# Patient Record
Sex: Female | Born: 1970 | Race: White | Hispanic: No | Marital: Married | Smoking: Never smoker
Health system: Southern US, Community
[De-identification: ages and names within clinical notes are randomized; demographics above are authoritative.]

---

## 2008-01-12 ENCOUNTER — Ambulatory Visit (HOSPITAL_COMMUNITY): Admission: RE | Admit: 2008-01-12 | Discharge: 2008-01-12 | Payer: Self-pay | Admitting: Obstetrics and Gynecology

## 2009-09-06 ENCOUNTER — Inpatient Hospital Stay (HOSPITAL_COMMUNITY): Admission: AD | Admit: 2009-09-06 | Discharge: 2009-09-06 | Payer: Self-pay | Admitting: Obstetrics and Gynecology

## 2009-09-19 ENCOUNTER — Inpatient Hospital Stay (HOSPITAL_COMMUNITY): Admission: AD | Admit: 2009-09-19 | Discharge: 2009-09-21 | Payer: Self-pay | Admitting: Obstetrics & Gynecology

## 2010-08-01 ENCOUNTER — Ambulatory Visit (HOSPITAL_COMMUNITY): Admission: RE | Admit: 2010-08-01 | Discharge: 2010-08-01 | Payer: Self-pay | Admitting: Obstetrics and Gynecology

## 2010-11-26 LAB — PREGNANCY, URINE: Preg Test, Ur: NEGATIVE

## 2010-12-01 LAB — CBC
HCT: 32.2 % — ABNORMAL LOW (ref 36.0–46.0)
HCT: 38.2 % (ref 36.0–46.0)
Hemoglobin: 10.9 g/dL — ABNORMAL LOW (ref 12.0–15.0)
Hemoglobin: 13 g/dL (ref 12.0–15.0)
MCHC: 34 g/dL (ref 30.0–36.0)
MCHC: 34 g/dL (ref 30.0–36.0)
MCV: 95.4 fL (ref 78.0–100.0)
MCV: 96.8 fL (ref 78.0–100.0)
Platelets: 145 10*3/uL — ABNORMAL LOW (ref 150–400)
Platelets: 158 10*3/uL (ref 150–400)
RBC: 3.33 MIL/uL — ABNORMAL LOW (ref 3.87–5.11)
RBC: 4.01 MIL/uL (ref 3.87–5.11)
RDW: 12.6 % (ref 11.5–15.5)
RDW: 12.9 % (ref 11.5–15.5)
WBC: 10.8 10*3/uL — ABNORMAL HIGH (ref 4.0–10.5)
WBC: 11 10*3/uL — ABNORMAL HIGH (ref 4.0–10.5)

## 2010-12-01 LAB — RPR: RPR Ser Ql: NONREACTIVE

## 2011-06-10 LAB — ABO/RH: ABO/RH(D): O POS

## 2015-11-28 ENCOUNTER — Encounter: Payer: Self-pay | Admitting: Sports Medicine

## 2015-11-28 ENCOUNTER — Ambulatory Visit
Admission: RE | Admit: 2015-11-28 | Discharge: 2015-11-28 | Disposition: A | Discharge status: Home / Self Care | Payer: BC Managed Care – PPO | Source: Ambulatory Visit | Attending: Sports Medicine | Admitting: Sports Medicine

## 2015-11-28 ENCOUNTER — Ambulatory Visit (INDEPENDENT_AMBULATORY_CARE_PROVIDER_SITE_OTHER): Payer: BC Managed Care – PPO | Admitting: Sports Medicine

## 2015-11-28 VITALS — BP 115/64 | Ht 64.0 in | Wt 115.0 lb

## 2015-11-28 DIAGNOSIS — M79672 Pain in left foot: Secondary | ICD-10-CM

## 2015-11-28 NOTE — Progress Notes (Signed)
   Subjective:    Patient ID: Sheri Shannon, female    DOB: 09/18/1970, 45 y.o.   MRN: [id number]  HPI chief complaint: Left foot pain  Very pleasant 45 year old female runner comes in today complaining of 6 weeks of left foot pain. No trauma that she can recall but a gradual onset of pain that she localizes to the plantar aspect of her MTP joint. It was initially present with running but is now to the point to where she has pain with walking as well. Her pain is intermittent. In fact, she was able to do a 6 mile trail run this past weekend with little discomfort. She has not noticed any swelling. She does get some pain that radiates proximally into her arch but she denies any heel pain. No change in shoes. She denies any increase in mileage. No similar problems in the past. She does get some occasional numbness into the arch of her foot as well as but denies any numbness into her toes. She has been using an off-the-shelf gel insert in her shoes and that has helped some. No prior surgeries to her foot or ankle in the past. No pain at rest.  Past medical history reviewed Medications reviewed Allergies reviewed    Review of Systems    as above Objective:   Physical Exam  Well-developed, fit appearing. No acute distress. Awake alert and oriented 3. Vital signs reviewed  Left foot: Patient has tenderness to palpation over the fibular sesamoid. No soft tissue swelling. There is some callus buildup along the medial aspect of her MTP joint and the medial most aspect of her right great toe. No tenderness to palpation across the dorsum of the foot. Very mild bunion deformity. Good passive and active MTP range of motion. Fairly well-preserved longitudinal arch with standing. Pronation with walking. Neurovascularly intact distally. Walking without a limp.  MSK ultrasound of the left foot was performed. Limited image of the sesamoid bones was obtained. There is some nonspecific edema around the fibular  sesamoid but no obvious signs of stress fracture. X-ray of the sesamoid bones is unremarkable. No obvious stress fracture. No bipartite sesamoid.      Assessment & Plan:   Left foot pain likely secondary to sesamoiditis  No running for the next week. Patient is okay to cross train on a bike. We will fit her running shoes with a green sports insole and a dancer's pad to help unload pressure on the sesamoids. She may try running again in one week but if she has returning pain then she will need to wait an additional week before resuming running again. She can ice as needed. Over-the-counter anti-inflammatories as needed. Follow-up with me in 3 weeks for reevaluation. If symptoms persist or worsen I may need to consider merits of further diagnostic imaging. Patient will call with questions or concerns prior to her follow-up visit.

## 2015-12-12 ENCOUNTER — Ambulatory Visit: Payer: BC Managed Care – PPO | Admitting: Sports Medicine

## 2015-12-19 ENCOUNTER — Encounter: Payer: Self-pay | Admitting: Family Medicine

## 2015-12-19 ENCOUNTER — Ambulatory Visit (INDEPENDENT_AMBULATORY_CARE_PROVIDER_SITE_OTHER): Payer: BC Managed Care – PPO | Admitting: Family Medicine

## 2015-12-19 VITALS — BP 110/68

## 2015-12-19 DIAGNOSIS — M2580 Other specified joint disorders, unspecified joint: Secondary | ICD-10-CM | POA: Diagnosis not present

## 2015-12-19 NOTE — Progress Notes (Signed)
   Subjective:    Patient ID: Adelene Amasayna Vieth, female    DOB: 07/19/1971, 45 y.o.   MRN: [id number]  HPI chief complaint: Left foot pain  Very pleasant 45 year old female runner comes in today for f/u of left foot pain/Fibular sesamoiditis. No trauma that she can recall but a gradual onset of pain that she localizes to the plantar aspect of her MTP joint.  No prior surgeries to her foot or ankle in the past. No pain at rest. She was last seen on 11/28/2015. She had a ultrasound consistent with sesamoiditis and a negative x-ray. She rested from running for a week and a half and then slowly returned to running. We did give her a dancer's pad with a green sports insole. Unfortunately the height of the dancers pad was too much and gave her a blister. She went online and found a gel adhesive dancer's pad which sticks to the foot, which has helped greatly. She reports having minimal pain in the last couple weeks. She did run a 8 mile run this past weekend without a pad and felt no pain. She does still have pain occasionally when walking her dog, who pulls very hard. She suspects walking her dog may have caused this pain initially as they just got the dog in December. Additionally she doesn't iron strength workout barefoot which includes plyometric jumps and squats. She has stopped doing these barefoot now. She is interested in doing a trial half marathon in several weeks.  Past medical history reviewed Medications reviewed Allergies reviewed    Review of Systems    as above Objective:   Physical Exam  Well-developed, fit appearing. No acute distress. Awake alert and oriented 3. Vital signs reviewed  Left foot: Patient has Minimal  tenderness to palpation over the fibular sesamoid. No soft tissue swelling. There is some callus buildup along the medial aspect of her MTP joint and the medial most aspect of her right great toe. No tenderness to palpation across the dorsum of the foot. Very mild bunion  deformity. Good passive and active MTP range of motion. Fairly well-preserved longitudinal arch with standing. Pronation with walking. Neurovascularly intact distally. Walking without a limp.  MSK ultrasound of the left foot was performed. Limited image of the sesamoid bones was obtained. There is  no significant edema around the fibular sesamoid nor obvious sign of stress fracture.        Assessment & Plan:   Left foot pain likely secondary to sesamoiditis, Fibular Mrs. Clippard is a very pleasant 45 year old runner who presents for follow-up of left fibular sesamoiditis. Over the last few weeks she has rested and slowly returned to running, although no trail running. She is doing very well and has no pain while running. Ultrasound and exam are largely unremarkable today. She suspects that some of her pain is due to a plyometric workout she does barefoot in addition to walking their are new dog, which is what triggers her pain at this point. She wishes to do a trail half marathon at the end of the month. We do think it is okay if she slowly transitions back to Trail running. She should continue wearing the adhesive sesamoid pad while walking her dog or while doing other things which exacerbate her pain. We will see her back as needed.

## 2016-07-01 ENCOUNTER — Ambulatory Visit: Payer: BC Managed Care – PPO | Admitting: Sports Medicine

## 2016-07-02 ENCOUNTER — Telehealth: Payer: Self-pay | Admitting: Sports Medicine

## 2016-07-02 ENCOUNTER — Encounter: Payer: Self-pay | Admitting: Sports Medicine

## 2016-07-02 ENCOUNTER — Ambulatory Visit
Admission: RE | Admit: 2016-07-02 | Discharge: 2016-07-02 | Disposition: A | Discharge status: Home / Self Care | Payer: BC Managed Care – PPO | Source: Ambulatory Visit | Attending: Sports Medicine | Admitting: Sports Medicine

## 2016-07-02 ENCOUNTER — Ambulatory Visit (INDEPENDENT_AMBULATORY_CARE_PROVIDER_SITE_OTHER): Payer: BC Managed Care – PPO | Admitting: Sports Medicine

## 2016-07-02 VITALS — BP 109/67 | Ht 64.0 in | Wt 130.0 lb

## 2016-07-02 DIAGNOSIS — M25531 Pain in right wrist: Secondary | ICD-10-CM

## 2016-07-02 NOTE — Assessment & Plan Note (Signed)
We'll get x-rays of her wrist to check for fracture. Concern for hook of hamate fracture or scaphoid fracture. She is most tender over the hamate. We will call with results. She can take over-the-counter anti-inflammatories as needed for pain. If her x-rays are negative she'll follow-up in 4 weeks if her symptoms are not improved.

## 2016-07-02 NOTE — Progress Notes (Signed)
  Sheri BasqueDayna Shannon - 45 y.o. female MRN [id number]  Date of birth: 1970/12/29  SUBJECTIVE:  Including CC & ROS.  CC: right wrist pain Complains of right wrist pain that began 2 months ago after falling off her mountain bike landing forward on flexed outstretched hand. She landed on her wrist. She denies any swelling and had some moderate pain. She reports that about a month later she fell again with less impact while she was trail running and she thinks she hit the same area.  She reports that her pain never has become better. She is right-handed. She has pain when she picks up things. Denies any numbness or tingling. She has not taken anything for the pain except for ibuprofen and Aleve. This does help the pain.   ROS: No unexpected weight loss, fever, chills, swelling, instability, muscle pain, numbness/tingling, redness, otherwise see HPI   PMHx - Updated and reviewed.  Contributory factors include: Negative PSHx - Updated and reviewed.  Contributory factors include:  Negative FHx - Updated and reviewed.  Contributory factors include:  Negative Social Hx - Updated and reviewed. Contributory factors include: Negative Medications - reviewed   DATA REVIEWED: None  PHYSICAL EXAM:  VS: BP:109/67  HR: bpm  TEMP: ( )  RESP:   HT:5\' 4"  (162.6 cm)   WT:130 lb (59 kg)  BMI:22.4 PHYSICAL EXAM: Gen: NAD, alert, cooperative with exam, well-appearing HEENT: clear conjunctiva,  CV:  no edema, capillary refill brisk, normal rate Resp: non-labored Skin: no rashes, normal turgor  Neuro: no gross deficits.  Psych:  alert and oriented  Wrist and hand: No swelling or erythema noted on inspection. Has tenderness to palpation along hamate bone, +anatomic snuffbox pain but mild, no distal radius or ulna, metacarpophalangeal joints, PIP joint, DIP joint Full range of motion of wrist at flexion, extension, supination, pronation, radial deviation, ulnar deviation bilaterally Full range of motion at  metacarpal phalangeal joints, PIP and DIP joints bilaterally Full strength and above planes of the wrist and hand Finkelstein's negative, Tinel's negative, Phalen's negative Sensation intact and pulses intact bilaterally  ASSESSMENT & PLAN:   Right wrist pain We'll get x-rays of her wrist to check for fracture. Concern for hook of hamate fracture or scaphoid fracture. She is most tender over the hamate. We will call with results. She can take over-the-counter anti-inflammatories as needed for pain. If her x-rays are negative she'll follow-up in 4 weeks if her symptoms are not improved.  Addendum: X-rays including a carpal tunnel view are negative for fracture. Before proceeding with further diagnostic imaging I've simply reassured the patient that her x-rays are normal. She will avoid any unnecessary pressure over the palm of her hand and if symptoms do not resolve over the next 3-4 weeks then she will notify me and I would reconsider merits of further diagnostic imaging likely in the form of an MRI.

## 2016-07-03 NOTE — Telephone Encounter (Signed)
Opened by accident

## 2017-09-15 IMAGING — CR DG FOOT COMPLETE 3+V*L*
4 series · 4 of 4 positions shown · non-contrast
Comparison: None.

CLINICAL DATA: Left foot pain for 6 weeks.

EXAM:
LEFT FOOT - COMPLETE 3+ VIEW

[t foot ap left (1 of 2)]
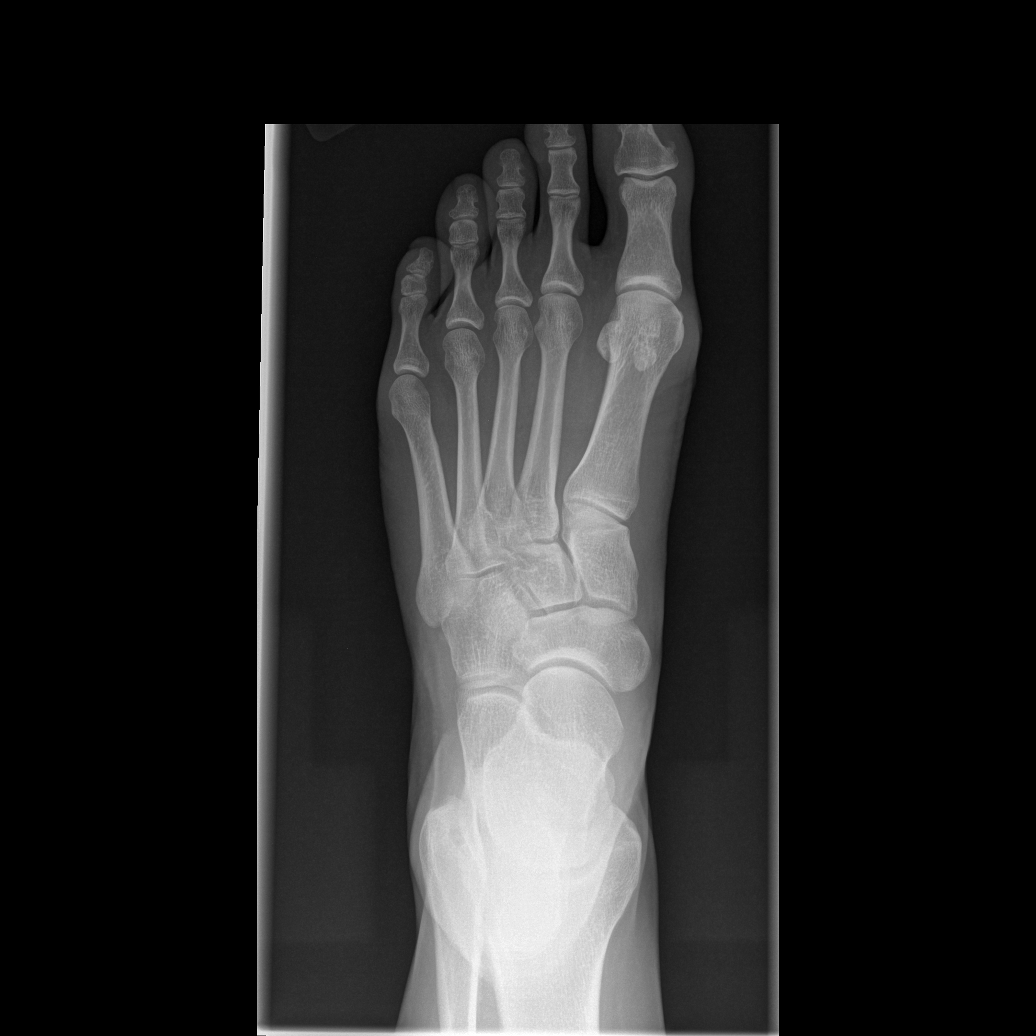

[t foot oblique left]
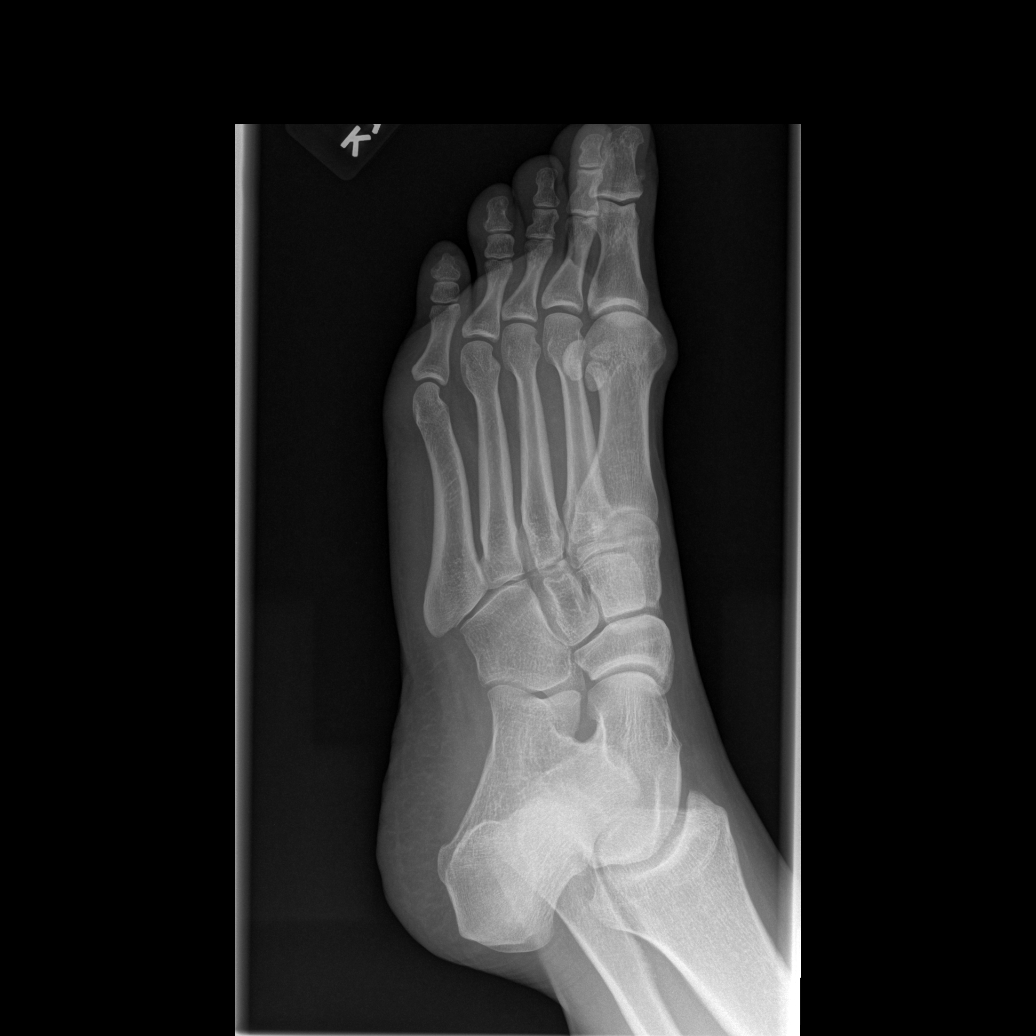

[t foot lat left]
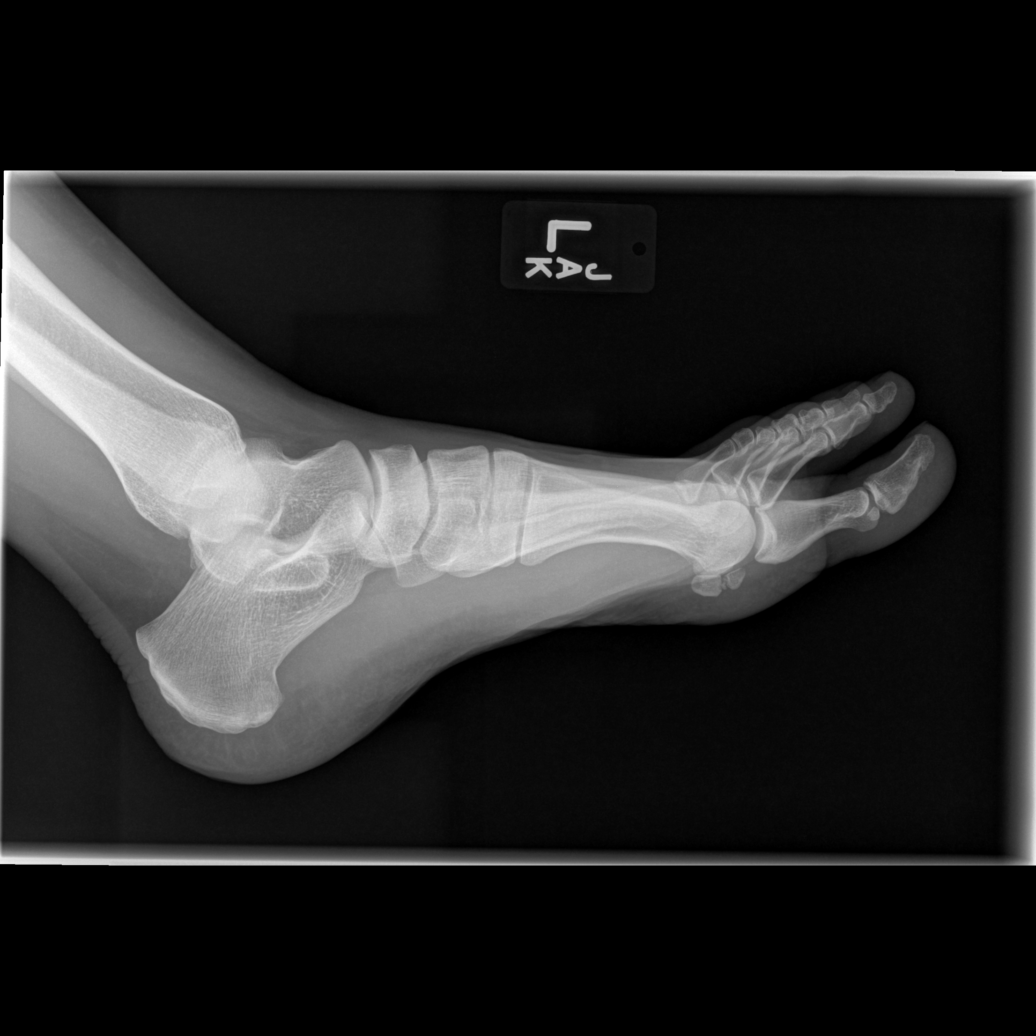

[t foot ap left (2 of 2)]
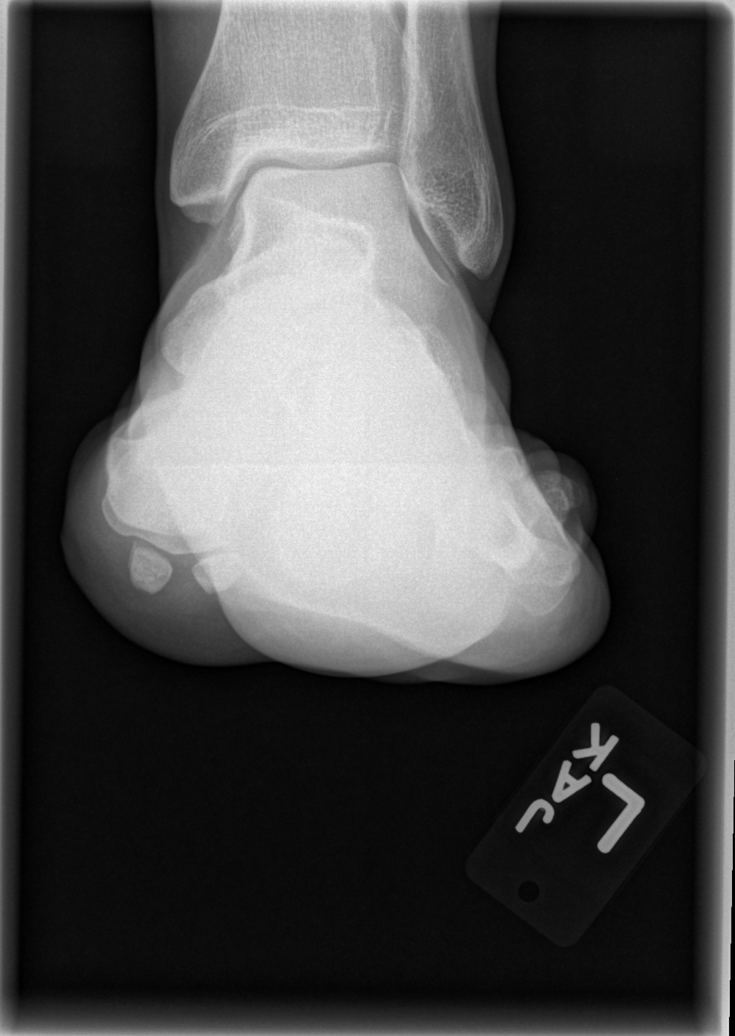

[4 of 4 positions shown; findings below may reference images not displayed]

FINDINGS: There is no evidence of fracture or dislocation. There is no
evidence of arthropathy or other focal bone abnormality. Soft
tissues are unremarkable.
IMPRESSION: Normal left foot.

## 2019-12-30 ENCOUNTER — Telehealth: Payer: Self-pay

## 2019-12-30 NOTE — Telephone Encounter (Signed)
Patient returned call and scheduled to see Dr. Katrinka Blazing.

## 2019-12-30 NOTE — Telephone Encounter (Signed)
Called patient to schedule appointment with Dr. Katrinka Blazing.

## 2020-01-03 ENCOUNTER — Ambulatory Visit: Payer: BC Managed Care – PPO | Admitting: Family Medicine

## 2020-01-05 NOTE — Progress Notes (Signed)
Sheri Shannon Sheri Shannon Sheri Shannon Phone: [phone] Subjective:   Sheri Shannon, am serving as a scribe for Dr. Hulan Saas. This visit occurred during the SARS-CoV-2 public health emergency.  Safety protocols were in place, including screening questions prior to the visit, additional usage of staff PPE, and extensive cleaning of exam room while observing appropriate contact time as indicated for disinfecting solutions.   I'm seeing this patient by the request  of:  Darcus Austin, MD (Inactive)  CC: Shoulder pain  [id number]  Sheri Shannon is a 49 y.o. female coming in with complaint of left shoulder pain. Patient states she has had pain since last summer. Expedition Engineer, building services. Kayaking does not hurt her shoulder at all. Pushes kayak up overhead to load on car. Pain over middle and anterior deltoid. Has pain with ER. Denies any numbness and or tingling. Uses Mobic prn.        Social History   Socioeconomic History  . Marital status: Married    Spouse name: Not on file  . Number of children: Not on file  . Years of education: Not on file  . Highest education level: Not on file  Occupational History  . Not on file  Tobacco Use  . Smoking status: Never Smoker  Substance and Sexual Activity  . Alcohol use: Not on file  . Drug use: Not on file  . Sexual activity: Not on file  Other Topics Concern  . Not on file  Social History Narrative  . Not on file   Social Determinants of Health   Financial Resource Strain:   . Difficulty of Paying Living Expenses:   Food Insecurity:   . Worried About Charity fundraiser in the Last Year:   . Arboriculturist in the Last Year:   Transportation Needs:   . Film/video editor (Medical):   Marland Kitchen Lack of Transportation (Non-Medical):   Physical Activity:   . Days of Exercise per Week:   . Minutes of Exercise per Session:   Stress:   . Feeling of Stress :   Social  Connections:   . Frequency of Communication with Friends and Family:   . Frequency of Social Gatherings with Friends and Family:   . Attends Religious Services:   . Active Member of Clubs or Organizations:   . Attends Archivist Meetings:   Marland Kitchen Marital Status:    Allergies  Allergen Reactions  . Bactrim [Sulfamethoxazole-Trimethoprim]   . Tetracyclines & Related    Shannon family history on file.  Current Outpatient Medications (Endocrine & Metabolic):  .  predniSONE (DELTASONE) 20 MG tablet, Take 40 mg by mouth daily.    Current Outpatient Medications (Analgesics):  .  HYDROcodone-acetaminophen (NORCO/VICODIN) 5-325 MG tablet, TAKE 1 TO 2 TABLETS BY MOUTH EVERY 6 HOURS AS NEEDED FOR SEVERE PAIN   Current Outpatient Medications (Other):  Marland Kitchen  Clindamycin-Benzoyl Per, Refr, gel, APPLY 1 GRAM ON THE SKIN IN THE MORNING   Reviewed prior external information including notes and imaging from  primary care provider As well as notes that were available from care everywhere and other healthcare systems.  Past medical history, social, surgical and family history all reviewed in electronic medical record.  Shannon pertanent information unless stated regarding to the chief complaint.   Review of Systems:  Shannon headache, visual changes, nausea, vomiting, diarrhea, constipation, dizziness, abdominal pain, skin rash, fevers, chills, night sweats, weight loss, swollen lymph nodes,  body aches, joint swelling, chest pain, shortness of breath, mood changes. POSITIVE muscle aches  Objective  Blood pressure 128/82, pulse 75, height 5\' 4"  (1.626 m), weight 121 lb (54.9 kg), SpO2 98 %.   General: Shannon apparent distress alert and oriented x3 mood and affect normal, dressed appropriately.  HEENT: Pupils equal, extraocular movements intact  Respiratory: Patient's speak in full sentences and does not appear short of breath  Cardiovascular: Shannon lower extremity edema, non tender, Shannon erythema  Neuro: Cranial  nerves II through XII are intact, neurovascularly intact in all extremities with 2+ DTRs and 2+ pulses.  Gait normal with good balance and coordination.  MSK:  Non tender with full range of motion and good stability and symmetric strength and tone of  elbows, wrist, hip, knee and ankles bilaterally.  Left shoulder exam shows the patient does have some swelling noted over the [facility] joint.  Patient has a positive crossover.  Mild impingement noted.  Full range of motion of the shoulder noted.  Limited musculoskeletal ultrasound was performed and interpreted by [facility]  Limited ultrasound of patient's shoulder shows very small amount of bursitis noted above the supraspinatus but very minimal overall.  Patient rotator cuff appears to be unremarkable.  Bicep tendon unremarkable.  Acromioclavicular joint has significant hypoechoic changes with positive motion  Impression: AC capsulitis  97110; 15 additional minutes spent for Therapeutic exercises as stated in above notes.  This included exercises focusing on stretching, strengthening, with significant focus on eccentric aspects.   Long term goals include an improvement in range of motion, strength, endurance as well as avoiding reinjury. Patient's frequency would include in 1-2 times a day, 3-5 times a week for a duration of 6-12 weeks. Shoulder Exercises that included:  Basic scapular stabilization to include adduction and depression of scapula Scaption, focusing on proper movement and good control Internal and External rotation utilizing a theraband, with elbow tucked at side entire time Rows with theraband   Proper technique shown and discussed handout in great detail with ATC.  All questions were discussed and answered.     Impression and Recommendations:     This case required medical decision making of moderate complexity. The above documentation has been reviewed and is accurate and complete Judi Saa, DO       Note: This  dictation was prepared with Dragon dictation along with smaller phrase technology. Any transcriptional errors that result from this process are unintentional.

## 2020-01-06 ENCOUNTER — Ambulatory Visit: Payer: BC Managed Care – PPO | Admitting: Family Medicine

## 2020-01-06 ENCOUNTER — Encounter: Payer: Self-pay | Admitting: Family Medicine

## 2020-01-06 ENCOUNTER — Ambulatory Visit (INDEPENDENT_AMBULATORY_CARE_PROVIDER_SITE_OTHER): Payer: BC Managed Care – PPO

## 2020-01-06 ENCOUNTER — Other Ambulatory Visit: Payer: Self-pay

## 2020-01-06 VITALS — BP 128/82 | HR 75 | Ht 64.0 in | Wt 121.0 lb

## 2020-01-06 DIAGNOSIS — M25519 Pain in unspecified shoulder: Secondary | ICD-10-CM | POA: Insufficient documentation

## 2020-01-06 DIAGNOSIS — M25512 Pain in left shoulder: Secondary | ICD-10-CM

## 2020-01-06 NOTE — Patient Instructions (Signed)
AC Capsulitis Pennsaid-Then use Voltaren Ice 20 min a day  Hands peripheral vision  See me again in 5-6 weeks

## 2020-01-06 NOTE — Assessment & Plan Note (Signed)
Neck throughoutLeft-sided with what appears to be more of a reactive capsulitis.  Discussed with patient about topical anti-inflammatories, date, home exercises and work with Event organiser.  Worsening symptoms will consider formal physical therapy and injections.  Follow-up again in 6 weeks

## 2020-02-15 ENCOUNTER — Ambulatory Visit: Payer: BC Managed Care – PPO | Admitting: Family Medicine

## 2020-02-15 ENCOUNTER — Other Ambulatory Visit: Payer: Self-pay

## 2020-02-15 ENCOUNTER — Encounter: Payer: Self-pay | Admitting: Family Medicine

## 2020-02-15 ENCOUNTER — Ambulatory Visit: Payer: Self-pay

## 2020-02-15 VITALS — BP 110/80 | HR 81 | Ht 64.0 in | Wt 132.0 lb

## 2020-02-15 DIAGNOSIS — M25512 Pain in left shoulder: Secondary | ICD-10-CM

## 2020-02-15 DIAGNOSIS — G8929 Other chronic pain: Secondary | ICD-10-CM

## 2020-02-15 NOTE — Patient Instructions (Addendum)
Injected AC joint today Keep doing exercises Ice 2x a day See me again in 6 weeks

## 2020-02-15 NOTE — Assessment & Plan Note (Signed)
Injection given today.  Tolerated procedure well.  Still no concern for any type of rotator cuff arthropathy or anything patient should do really well with conservative therapy.  Discussed icing regimen and home exercises, which activities to do which wants to avoid.  Chronic problem with mild worsening symptoms at last exam.  Follow-up in 4 to 8 weeks.

## 2020-02-15 NOTE — Progress Notes (Signed)
Sheri Shannon Phone: [phone] Subjective:   Sheri Shannon, am serving as a scribe for Dr. Hulan Saas. This visit occurred during the SARS-CoV-2 public health emergency.  Safety protocols were in place, including screening questions prior to the visit, additional usage of staff PPE, and extensive cleaning of exam room while observing appropriate contact time as indicated for disinfecting solutions.   I'm seeing this patient by the request  of:  Sheri Austin, MD (Inactive)  CC: Left shoulder pain follow-up  [id number]   01/06/2020 Neck throughoutLeft-sided with what appears to be more of a reactive capsulitis.  Discussed with patient about topical anti-inflammatories, date, home exercises and work with Product/process development scientist.  Worsening symptoms will consider formal physical therapy and injections.  Follow-up again in 6 weeks  Update 02/15/2020 Sheri Shannon is a 49 y.o. female coming in with complaint of AC joint pain. Patient states that her pain is somewhat better. She has been able to paddle and SUP. Pain can get achy and does have days where intensity increases. Ice and exercises have been helping. Denies any radiating symptoms.  Patient still feels like she is protecting it too much.  Would like to be more active but finding it difficult secondary to the pain.      Shannon past medical history on file. Shannon past surgical history on file. Social History   Socioeconomic History  . Marital status: Married    Spouse name: Not on file  . Number of children: Not on file  . Years of education: Not on file  . Highest education level: Not on file  Occupational History  . Not on file  Tobacco Use  . Smoking status: Never Smoker  Substance and Sexual Activity  . Alcohol use: Not on file  . Drug use: Not on file  . Sexual activity: Not on file  Other Topics Concern  . Not on file  Social History Narrative  . Not on file    Social Determinants of Health   Financial Resource Strain:   . Difficulty of Paying Living Expenses:   Food Insecurity:   . Worried About Charity fundraiser in the Last Year:   . Arboriculturist in the Last Year:   Transportation Needs:   . Film/video editor (Medical):   Marland Kitchen Lack of Transportation (Non-Medical):   Physical Activity:   . Days of Exercise per Week:   . Minutes of Exercise per Session:   Stress:   . Feeling of Stress :   Social Connections:   . Frequency of Communication with Friends and Family:   . Frequency of Social Gatherings with Friends and Family:   . Attends Religious Services:   . Active Member of Clubs or Organizations:   . Attends Archivist Meetings:   Marland Kitchen Marital Status:    Allergies  Allergen Reactions  . Bactrim [Sulfamethoxazole-Trimethoprim]   . Tetracyclines & Related    Shannon family history on file.  Current Outpatient Medications (Endocrine & Metabolic):  .  predniSONE (DELTASONE) 20 MG tablet, Take 40 mg by mouth daily.    Current Outpatient Medications (Analgesics):  .  HYDROcodone-acetaminophen (NORCO/VICODIN) 5-325 MG tablet, TAKE 1 TO 2 TABLETS BY MOUTH EVERY 6 HOURS AS NEEDED FOR SEVERE PAIN   Current Outpatient Medications (Other):  Marland Kitchen  Clindamycin-Benzoyl Per, Refr, gel, APPLY 1 GRAM ON THE SKIN IN THE MORNING   Reviewed prior external information including notes  and imaging from  primary care provider As well as notes that were available from care everywhere and other healthcare systems.  Past medical history, social, surgical and family history all reviewed in electronic medical record.  Shannon pertanent information unless stated regarding to the chief complaint.   Review of Systems:  Shannon headache, visual changes, nausea, vomiting, diarrhea, constipation, dizziness, abdominal pain, skin rash, fevers, chills, night sweats, weight loss, swollen lymph nodes, body aches, joint swelling, chest pain, shortness of breath,  mood changes. POSITIVE muscle aches  Objective  Blood pressure 110/80, pulse 81, height 5\' 4"  (1.626 m), weight 132 lb (59.9 kg), SpO2 99 %.   General: Shannon apparent distress alert and oriented x3 mood and affect normal, dressed appropriately.  HEENT: Pupils equal, extraocular movements intact  Respiratory: Patient's speak in full sentences and does not appear short of breath  Cardiovascular: Shannon lower extremity edema, non tender, Shannon erythema  Neuro: Cranial nerves II through XII are intact, neurovascularly intact in all extremities with 2+ DTRs and 2+ pulses.  Gait normal with good balance and coordination.  MSK: Left shoulder exam shows the patient does have some mild decrease in range of motion.  Positive crossover test.  Tender to palpation over the acromioclavicular joint and continued capsulitis noted.  Procedure: Real-time Ultrasound Guided Injection of left acromioclavicular joint Device: GE Logiq Q7 Ultrasound guided injection is preferred based studies that show increased duration, increased effect, greater accuracy, decreased procedural pain, increased response rate, and decreased cost with ultrasound guided versus blind injection.  Verbal informed consent obtained.  Time-out conducted.  Noted Shannon overlying erythema, induration, or other signs of local infection.  Skin prepped in a sterile fashion.  Local anesthesia: Topical Ethyl chloride.  With sterile technique and under real time ultrasound guidance: With a 25-gauge half inch needle injected with 0.5 cc of 0.5% Marcaine and 0.5 cc of Kenalog 40 mg/mL Completed without difficulty  Pain immediately resolved suggesting accurate placement of the medication.  Advised to call if fevers/chills, erythema, induration, drainage, or persistent bleeding.  Images permanently stored and available for review in the ultrasound unit.  Impression: Technically successful ultrasound guided injection.    Impression and Recommendations:     The  above documentation has been reviewed and is accurate and complete , DO       Note: This dictation was prepared with Dragon dictation along with smaller phrase technology. Any transcriptional errors that result from this process are unintentional.

## 2020-03-28 ENCOUNTER — Encounter: Payer: Self-pay | Admitting: Family Medicine

## 2020-03-28 ENCOUNTER — Ambulatory Visit: Payer: Self-pay

## 2020-03-28 ENCOUNTER — Other Ambulatory Visit: Payer: Self-pay

## 2020-03-28 ENCOUNTER — Ambulatory Visit: Payer: BC Managed Care – PPO | Admitting: Family Medicine

## 2020-03-28 VITALS — BP 120/80 | HR 66 | Ht 64.0 in | Wt 135.0 lb

## 2020-03-28 DIAGNOSIS — M25512 Pain in left shoulder: Secondary | ICD-10-CM

## 2020-03-28 DIAGNOSIS — M25531 Pain in right wrist: Secondary | ICD-10-CM | POA: Diagnosis not present

## 2020-03-28 NOTE — Assessment & Plan Note (Signed)
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  Ultrasound was fairly unremarkable.  Discussed icing regimen and home exercise.  Topical anti-inflammatories.  We discussed bracing and change in ergonomics including to a vertical mouse.  Follow-up with me again in 4 to 8 weeks worsening symptoms consider formal physical therapy and potential injection into the EC[facility]

## 2020-03-28 NOTE — Patient Instructions (Addendum)
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

## 2020-03-28 NOTE — Progress Notes (Signed)
[facility] [address] [place] [address] Phone: [phone] Subjective:   I Ronelle Nigh am serving as a Neurosurgeon for Dr. Antoine Primas.  This visit occurred during the SARS-CoV-2 public health emergency.  Safety protocols were in place, including screening questions prior to the visit, additional usage of staff PPE, and extensive cleaning of exam room while observing appropriate contact time as indicated for disinfecting solutions.   I'm seeing this patient by the request  of:  Shaune Pollack, MD (Inactive)  CC: Left shoulder pain follow-up, right wrist pain  [id number]   02/15/2020 Injection given today.  Tolerated procedure well.  Still no concern for any type of rotator cuff arthropathy or anything patient should do really well with conservative therapy.  Discussed icing regimen and home exercises, which activities to do which wants to avoid.  Chronic problem with mild worsening symptoms at last exam.  Follow-up in 4 to 8 weeks.  Update 03/28/2020 Sheri Shannon is a 49 y.o. female coming in with complaint of left shoulder pain. Patient states she is feeling better since her injection. Not 100%. States she has right wrist pain. Started last fall. States that this past week her wrist was throbbing. Ulnar side. States the pain is not that bad but is due to overuse.      No past medical history on file. No past surgical history on file. Social History   Socioeconomic History  . Marital status: Married    Spouse name: Not on file  . Number of children: Not on file  . Years of education: Not on file  . Highest education level: Not on file  Occupational History  . Not on file  Tobacco Use  . Smoking status: Never Smoker  Substance and Sexual Activity  . Alcohol use: Not on file  . Drug use: Not on file  . Sexual activity: Not on file  Other Topics Concern  . Not on file  Social History Narrative  . Not on file   Social Determinants of  Health   Financial Resource Strain:   . Difficulty of Paying Living Expenses:   Food Insecurity:   . Worried About Programme researcher, broadcasting/film/video in the Last Year:   . Barista in the Last Year:   Transportation Needs:   . Freight forwarder (Medical):   Marland Kitchen Lack of Transportation (Non-Medical):   Physical Activity:   . Days of Exercise per Week:   . Minutes of Exercise per Session:   Stress:   . Feeling of Stress :   Social Connections:   . Frequency of Communication with Friends and Family:   . Frequency of Social Gatherings with Friends and Family:   . Attends Religious Services:   . Active Member of Clubs or Organizations:   . Attends Banker Meetings:   Marland Kitchen Marital Status:    Allergies  Allergen Reactions  . Bactrim [Sulfamethoxazole-Trimethoprim]   . Tetracyclines & Related    No family history on file.  Current Outpatient Medications (Endocrine & Metabolic):  .  predniSONE (DELTASONE) 20 MG tablet, Take 40 mg by mouth daily.    Current Outpatient Medications (Analgesics):  .  HYDROcodone-acetaminophen (NORCO/VICODIN) 5-325 MG tablet, TAKE 1 TO 2 TABLETS BY MOUTH EVERY 6 HOURS AS NEEDED FOR SEVERE PAIN   Current Outpatient Medications (Other):  Marland Kitchen  Clindamycin-Benzoyl Per, Refr, gel, APPLY 1 GRAM ON THE SKIN IN THE MORNING   Reviewed prior external information including  notes and imaging from  primary care provider As well as notes that were available from care everywhere and other healthcare systems.  Past medical history, social, surgical and family history all reviewed in electronic medical record.  No pertanent information unless stated regarding to the chief complaint.   Review of Systems:  No headache, visual changes, nausea, vomiting, diarrhea, constipation, dizziness, abdominal pain, skin rash, fevers, chills, night sweats, weight loss, swollen lymph nodes, body aches, joint swelling, chest pain, shortness of breath, mood changes. POSITIVE  muscle aches  Objective  Blood pressure 120/80, pulse 66, height 5\' 4"  (1.626 m), weight 135 lb (61.2 kg), SpO2 98 %.   General: No apparent distress alert and oriented x3 mood and affect normal, dressed appropriately.  HEENT: Pupils equal, extraocular movements intact  Respiratory: Patient's speak in full sentences and does not appear short of breath  Cardiovascular: No lower extremity edema, non tender, no erythema  Neuro: Cranial nerves II through XII are intact, neurovascularly intact in all extremities with 2+ DTRs and 2+ pulses.  Gait normal with good balance and coordination.  MSK: Left shoulder exam some very mild positive impingement with crossover  Right wrist shows patient is tender to palpation over the ECU.  Mild pain with compression over Guyon's canal.  Negative Tinel's over at the elbow.  Patient has good grip strength.  Negative Tinel's over the carpal tunnel.  Limited musculoskeletal ultrasound was performed and interpreted by  Limited ultrasound shows that patient's median nerve is within regular diameter.  Some mild enlargement of the ulnar nerve.  Patient does have hypoechoic changes within the extensor carpi ulnaris area.    Impression and Recommendations:     The above documentation has been reviewed and is accurate and complete Judi Saa, DO       Note: This dictation was prepared with Dragon dictation along with smaller phrase technology. Any transcriptional errors that result from this process are unintentional.

## 2020-03-28 NOTE — Assessment & Plan Note (Signed)
Significant improvement at this time. 

## 2020-05-25 NOTE — Progress Notes (Signed)
[facility] [address] [place] [address] Phone: [phone] Subjective:   Sheri Shannon, am serving as a scribe for Dr. Antoine Primas. This visit occurred during the SARS-CoV-2 public health emergency.  Safety protocols were in place, including screening questions prior to the visit, additional usage of staff PPE, and extensive cleaning of exam room while observing appropriate contact time as indicated for disinfecting solutions.   I'm seeing this patient by the request  of:  Shaune Pollack, MD (Inactive)  CC: Wrist pain follow-up  [id number]   03/28/2020 Patient does have some mild ulnar neuropathy as well as what seems to be more in extensor carpi ulnaris tendinitis.  Ultrasound was fairly unremarkable.  Discussed icing regimen and home exercise.  Topical anti-inflammatories.  We discussed bracing and change in ergonomics including to a vertical mouse.  Follow-up with me again in 4 to 8 weeks worsening symptoms consider formal physical therapy and potential injection into the ECU  Significant improvement at this time.  Update 05/25/2020 Sheri Shannon is a 49 y.o. female coming in with complaint of wrist and AC joint pain. States that she has been taping her wrist with paddling but does not notice a difference. Pain with wrist curls with 1#. Does feel improvement but pain is still persistent with paddling.  Patient states nothing that stops her from any activity and would state that she is 85 to 90% better than what she was.  Patient just wants to make sure that she can continue to do the exercises that she is doing.       No past medical history on file. No past surgical history on file. Social History   Socioeconomic History  . Marital status: Married    Spouse name: Not on file  . Number of children: Not on file  . Years of education: Not on file  . Highest education level: Not on file  Occupational History  . Not on file  Tobacco Use   . Smoking status: Never Smoker  Substance and Sexual Activity  . Alcohol use: Not on file  . Drug use: Not on file  . Sexual activity: Not on file  Other Topics Concern  . Not on file  Social History Narrative  . Not on file   Social Determinants of Health   Financial Resource Strain:   . Difficulty of Paying Living Expenses: Not on file  Food Insecurity:   . Worried About Programme researcher, broadcasting/film/video in the Last Year: Not on file  . Ran Out of Food in the Last Year: Not on file  Transportation Needs:   . Lack of Transportation (Medical): Not on file  . Lack of Transportation (Non-Medical): Not on file  Physical Activity:   . Days of Exercise per Week: Not on file  . Minutes of Exercise per Session: Not on file  Stress:   . Feeling of Stress : Not on file  Social Connections:   . Frequency of Communication with Friends and Family: Not on file  . Frequency of Social Gatherings with Friends and Family: Not on file  . Attends Religious Services: Not on file  . Active Member of Clubs or Organizations: Not on file  . Attends Banker Meetings: Not on file  . Marital Status: Not on file   Allergies  Allergen Reactions  . Bactrim [Sulfamethoxazole-Trimethoprim]   . Tetracyclines & Related    No family history on file.  Current Outpatient Medications (Endocrine &  Metabolic):  .  predniSONE (DELTASONE) 20 MG tablet, Take 40 mg by mouth daily.    Current Outpatient Medications (Analgesics):  .  HYDROcodone-acetaminophen (NORCO/VICODIN) 5-325 MG tablet, TAKE 1 TO 2 TABLETS BY MOUTH EVERY 6 HOURS AS NEEDED FOR SEVERE PAIN   Current Outpatient Medications (Other):  Marland Kitchen  Clindamycin-Benzoyl Per, Refr, gel, APPLY 1 GRAM ON THE SKIN IN THE MORNING   Reviewed prior external information including notes and imaging from  primary care provider As well as notes that were available from care everywhere and other healthcare systems.  Past medical history, social, surgical and  family history all reviewed in electronic medical record.  No pertanent information unless stated regarding to the chief complaint.   Review of Systems:  No headache, visual changes, nausea, vomiting, diarrhea, constipation, dizziness, abdominal pain, skin rash, fevers, chills, night sweats, weight loss, swollen lymph nodes, body aches, joint swelling, chest pain, shortness of breath, mood changes. POSITIVE muscle aches  Objective  Blood pressure 114/82, pulse 72, height 5\' 4"  (1.626 m), weight 135 lb (61.2 kg), SpO2 98 %.   General: No apparent distress alert and oriented x3 mood and affect normal, dressed appropriately.  HEENT: Pupils equal, extraocular movements intact  Respiratory: Patient's speak in full sentences and does not appear short of breath  Cardiovascular: No lower extremity edema, non tender, no erythema  Neuro: Cranial nerves II through XII are intact, neurovascularly intact in all extremities with 2+ DTRs and 2+ pulses.  Gait normal with good balance and coordination.  MSK: Wrist: Right Inspection normal with no visible erythema or swelling.  Very minimal swelling over the ECU otherwise unremarkable ROM smooth and normal with good flexion and extension and ulnar/radial deviation that is symmetrical with opposite wrist. Palpation is normal over metacarpals, navicular, lunate, and TFCC; tendons without tenderness/ swelling No snuffbox tenderness. No tenderness over Canal of Guyon. Strength 5/5 in all directions without pain. Negative Finkelstein, tinel's and phalens. Negative Watson's test. Contralateral wrist unremarkable   Impression and Recommendations:     The above documentation has been reviewed and is accurate and complete , DO       Note: This dictation was prepared with Dragon dictation along with smaller phrase technology. Any transcriptional errors that result from this process are unintentional.

## 2020-05-29 ENCOUNTER — Other Ambulatory Visit: Payer: Self-pay

## 2020-05-29 ENCOUNTER — Encounter: Payer: Self-pay | Admitting: Family Medicine

## 2020-05-29 ENCOUNTER — Ambulatory Visit: Payer: Self-pay

## 2020-05-29 ENCOUNTER — Ambulatory Visit: Payer: BC Managed Care – PPO | Admitting: Family Medicine

## 2020-05-29 VITALS — BP 114/82 | HR 72 | Ht 64.0 in | Wt 135.0 lb

## 2020-05-29 DIAGNOSIS — M25531 Pain in right wrist: Secondary | ICD-10-CM | POA: Diagnosis not present

## 2020-05-29 NOTE — Assessment & Plan Note (Signed)
Much better at this time, no significant pain.  Patient shown how to brace appropriately.  Patient did not complain of shoulder pain.  Follow-up again in 2 months if needed

## 2020-05-29 NOTE — Patient Instructions (Signed)
Tape 2 finger breaths with activity Doing better See me in 2 months if not completely done we will talk about PT or injection

## 2020-07-29 NOTE — Progress Notes (Signed)
[facility] [address] [place] [address] Phone: [phone] Subjective:   [name], am serving as a scribe for Dr. Antoine Primas. This visit occurred during the SARS-CoV-2 public health emergency.  Safety protocols were in place, including screening questions prior to the visit, additional usage of staff PPE, and extensive cleaning of exam room while observing appropriate contact time as indicated for disinfecting solutions.   I'm seeing this patient by the request  of:  Shaune Pollack, MD (Inactive)  CC: wrist pain follow up   [id number]   05/29/2020 Much better at this time, no significant pain.  Patient shown how to brace appropriately.  Patient did not complain of shoulder pain.  Follow-up again in 2 months if needed   Update 07/30/2020 Sheri Shannon is a 49 y.o. female coming in with complaint of right wrist pain. Patient states that she thought she broke middle finger after hitting her finger on something when closing her pool. Patient did have problems with flexing finger but pain is improving.        No past medical history on file. No past surgical history on file. Social History   Socioeconomic History  . Marital status: Married    Spouse name: Not on file  . Number of children: Not on file  . Years of education: Not on file  . Highest education level: Not on file  Occupational History  . Not on file  Tobacco Use  . Smoking status: Never Smoker  Substance and Sexual Activity  . Alcohol use: Not on file  . Drug use: Not on file  . Sexual activity: Not on file  Other Topics Concern  . Not on file  Social History Narrative  . Not on file   Social Determinants of Health   Financial Resource Strain:   . Difficulty of Paying Living Expenses: Not on file  Food Insecurity:   . Worried About Programme researcher, broadcasting/film/video in the Last Year: Not on file  . Ran Out of Food in the Last Year: Not on file  Transportation Needs:     . Lack of Transportation (Medical): Not on file  . Lack of Transportation (Non-Medical): Not on file  Physical Activity:   . Days of Exercise per Week: Not on file  . Minutes of Exercise per Session: Not on file  Stress:   . Feeling of Stress : Not on file  Social Connections:   . Frequency of Communication with Friends and Family: Not on file  . Frequency of Social Gatherings with Friends and Family: Not on file  . Attends Religious Services: Not on file  . Active Member of Clubs or Organizations: Not on file  . Attends Banker Meetings: Not on file  . Marital Status: Not on file   Allergies  Allergen Reactions  . Bactrim [Sulfamethoxazole-Trimethoprim]   . Tetracyclines & Related    No family history on file.  Current Outpatient Medications (Endocrine & Metabolic):  .  predniSONE (DELTASONE) 20 MG tablet, Take 40 mg by mouth daily.    Current Outpatient Medications (Analgesics):  .  HYDROcodone-acetaminophen (NORCO/VICODIN) 5-325 MG tablet, TAKE 1 TO 2 TABLETS BY MOUTH EVERY 6 HOURS AS NEEDED FOR SEVERE PAIN   Current Outpatient Medications (Other):  Marland Kitchen  Clindamycin-Benzoyl Per, Refr, gel, APPLY 1 GRAM ON THE SKIN IN THE MORNING   Reviewed prior external information including notes and imaging from  primary care provider As well as notes that  were available from care everywhere and other healthcare systems.  Past medical history, social, surgical and family history all reviewed in electronic medical record.  No pertanent information unless stated regarding to the chief complaint.   Review of Systems:  No headache, visual changes, nausea, vomiting, diarrhea, constipation, dizziness, abdominal pain, skin rash, fevers, chills, night sweats, weight loss, swollen lymph nodes, body aches, joint swelling, chest pain, shortness of breath, mood changes. POSITIVE muscle aches  Objective  Blood pressure 112/72, pulse 70, height 5\' 4"  (1.626 m), weight 134 lb (60.8  kg), SpO2 99 %.   General: No apparent distress alert and oriented x3 mood and affect normal, dressed appropriately.  HEENT: Pupils equal, extraocular movements intact  Respiratory: Patient's speak in full sentences and does not appear short of breath  Cardiovascular: No lower extremity edema, non tender, no erythema  Neuro: Cranial nerves II through XII are intact, neurovascularly intact in all extremities with 2+ DTRs and 2+ pulses.  Gait normal with good balance and coordination.  MSK: Right wrist exam is unremarkable.  Patient does have full range of motion.  Good grip strength noted.  Negative Tinel's. Left hand shows that patient does have a healing abrasion from the DIP.  Does have good granulation tissue.  No erythema of the skin.  Patient lacks the last 2 degrees of extension of the DIP compared to the contralateral side.  Patient is minorly tender over the joint more proximally.  No angulation noted.  Neurovascularly intact    Impression and Recommendations:     The above documentation has been reviewed and is accurate and complete , DO

## 2020-07-30 ENCOUNTER — Ambulatory Visit: Payer: BC Managed Care – PPO | Admitting: Family Medicine

## 2020-07-30 ENCOUNTER — Encounter: Payer: Self-pay | Admitting: Family Medicine

## 2020-07-30 ENCOUNTER — Ambulatory Visit: Payer: Self-pay

## 2020-07-30 ENCOUNTER — Other Ambulatory Visit: Payer: Self-pay

## 2020-07-30 VITALS — BP 112/72 | HR 70 | Ht 64.0 in | Wt 134.0 lb

## 2020-07-30 DIAGNOSIS — M25531 Pain in right wrist: Secondary | ICD-10-CM | POA: Diagnosis not present

## 2020-07-30 DIAGNOSIS — S6990XA Unspecified injury of unspecified wrist, hand and finger(s), initial encounter: Secondary | ICD-10-CM | POA: Insufficient documentation

## 2020-07-30 DIAGNOSIS — M79645 Pain in left finger(s): Secondary | ICD-10-CM

## 2020-07-30 DIAGNOSIS — S6992XA Unspecified injury of left wrist, hand and finger(s), initial encounter: Secondary | ICD-10-CM

## 2020-07-30 NOTE — Assessment & Plan Note (Signed)
Nearly completely resolved at this time. 

## 2020-07-30 NOTE — Patient Instructions (Signed)
Great to see you  If finger is not perfect for [place] day give [place] a message Otherwise Happy holidays!

## 2020-07-30 NOTE — Assessment & Plan Note (Signed)
Patient does have a scab at the DIP on the dorsal aspect.  Appears to have full flexion but does have some very mild limited extension of the DIP but could be secondary to the pain or the tightness from the scab.  Good granulation tissue with no significant signs of infection.  Discussed continuing and covered until the skin is healed.  Follow-up with me as needed

## 2020-10-23 ENCOUNTER — Telehealth: Payer: Self-pay

## 2020-10-23 NOTE — Telephone Encounter (Signed)
NOTES ON FILE FROM [facility],[phone], SENT REFERRAL TO SCHEDULING 

## 2020-11-23 ENCOUNTER — Encounter: Payer: Self-pay | Admitting: Radiology

## 2020-11-23 ENCOUNTER — Other Ambulatory Visit: Payer: Self-pay

## 2020-11-23 ENCOUNTER — Ambulatory Visit (INDEPENDENT_AMBULATORY_CARE_PROVIDER_SITE_OTHER): Payer: BC Managed Care – PPO

## 2020-11-23 ENCOUNTER — Ambulatory Visit (INDEPENDENT_AMBULATORY_CARE_PROVIDER_SITE_OTHER): Payer: BC Managed Care – PPO | Admitting: Cardiology

## 2020-11-23 ENCOUNTER — Encounter: Payer: Self-pay | Admitting: Cardiology

## 2020-11-23 VITALS — BP 127/87 | HR 63 | Ht 64.0 in | Wt 133.8 lb

## 2020-11-23 DIAGNOSIS — R002 Palpitations: Secondary | ICD-10-CM

## 2020-11-23 DIAGNOSIS — R079 Chest pain, unspecified: Secondary | ICD-10-CM

## 2020-11-23 NOTE — Patient Instructions (Signed)
Medication Instructions:  Your physician recommends that you continue on your current medications as directed. Please refer to the Current Medication list given to you today.  Testing/Procedures: Your physician has requested that you have an echocardiogram. Echocardiography is a painless test that uses sound waves to create images of your heart. It provides your doctor with information about the size and shape of your heart and how well your heart's chambers and valves are working. This procedure takes approximately one hour. There are no restrictions for this procedure.  This will be done at our [address] location:  [address]   ZIO XT- Long Term Monitor Instructions   Your physician has requested you wear your ZIO patch monitor___14___days.   This is a single patch monitor.  [employer] supplies one patch monitor per enrollment.  Additional stickers are not available.   Please do not apply patch if you will be having a Nuclear Stress Test, Echocardiogram, Cardiac CT, MRI, or Chest Xray during the time frame you would be wearing the monitor. The patch cannot be worn during these tests.  You cannot remove and re-apply the ZIO XT patch monitor.   Your ZIO patch monitor will be sent USPS Priority mail from [employer] directly to your home address. The monitor may also be mailed to a PO BOX if home delivery is not available.   It may take 3-5 days to receive your monitor after you have been enrolled.   Once you have received you monitor, please review enclosed instructions.  Your monitor has already been registered assigning a specific monitor serial # to you.   Applying the monitor   Shave hair from upper left chest.   Hold abrader disc by orange tab.  Rub abrader in 40 strokes over left upper chest as indicated in your monitor instructions.   Clean area with 4 enclosed alcohol pads .  Use all pads to assure are is cleaned thoroughly.  Let dry.   Apply patch  as indicated in monitor instructions.  Patch will be place under collarbone on left side of chest with arrow pointing upward.   Rub patch adhesive wings for 2 minutes.Remove white label marked "1".  Remove white label marked "2".  Rub patch adhesive wings for 2 additional minutes.   While looking in a mirror, press and release button in center of patch.  A small green light will flash 3-4 times .  This will be your only indicator the monitor has been turned on.     Do not shower for the first 24 hours.  You may shower after the first 24 hours.   Press button if you feel a symptom. You will hear a small click.  Record Date, Time and Symptom in the Patient Log Book.   When you are ready to remove patch, follow instructions on last 2 pages of Patient Log Book.  Stick patch monitor onto last page of Patient Log Book.   Place Patient Log Book in Blue box.  Use locking tab on box and tape box closed securely.  The Orange and White box has prepaid postage on it.  Please place in mailbox as soon as possible.  Your physician should have your test results approximately 7 days after the monitor has been mailed back to [employer].   Call [employer] Customer Care at [phone] if you have questions regarding your ZIO XT patch monitor.  Call them immediately if you see an orange light blinking on your   monitor.   If your monitor falls off in less than 4 days contact our Monitor department at [phone].  If your monitor becomes loose or falls off after 4 days call [employer] at [phone] for suggestions on securing your monitor.    Follow-Up: At [facility], you and your health needs are our priority.  As part of our continuing mission to provide you with exceptional heart care, we have created designated Provider Care Teams.  These Care Teams include your primary Cardiologist (physician) and Advanced Practice Providers (APPs -  Physician Assistants and Nurse Practitioners) who all work  together to provide you with the care you need, when you need it.  We recommend signing up for the patient portal called "MyChart".  Sign up information is provided on this After Visit Summary.  MyChart is used to connect with patients for Virtual Visits (Telemedicine).  Patients are able to view lab/test results, encounter notes, upcoming appointments, etc.  Non-urgent messages can be sent to your provider as well.   To learn more about what you can do with MyChart, go to [email or web address].    Your next appointment:   3 month(s)  The format for your next appointment:   In Person  Provider:   Christopher Schumann, MD    

## 2020-11-23 NOTE — Progress Notes (Signed)
Enrolled patient for a 14 day Zio XT  monitor to be mailed to patients home  °

## 2020-11-23 NOTE — Progress Notes (Signed)
Cardiology Office Note:    Date:  11/25/2020   ID:  Sheri Shannon, DOB 06-30-1971, MRN [id number]  PCP:  Shaune Pollack, MD (Inactive)  Cardiologist:  No primary care provider on file.  Electrophysiologist:  None   Referring MD: Shon Hale, *   Chief Complaint  Patient presents with  . Chest Pain    History of Present Illness:    Sheri Shannon is a 50 y.o. female with a hx of anxiety who is referred by Dr. Chanetta Marshall for evaluation of chest pain.  She reports that she has been having episodes where her heart is fluttering, associated with chest tightness.  She is very active, runs 8 miles 1 day a week and then 3 miles another 2-3 times per week.  She denies any symptoms with this.  Reports during episodes of heart fluttering will have chest tightness, can last about 15 minutes.  She denies any lightheadedness, syncope, lower extremity edema.  No smoking history.  Family history includes father had CABG in 38s despite being marathon runner.   No past medical history on file.  No past surgical history on file.  Current Medications: Current Meds  Medication Sig  . traZODone (DESYREL) 50 MG tablet Take 25 mg by mouth at bedtime as needed.  . venlafaxine XR (EFFEXOR-XR) 150 MG 24 hr capsule Take 150 mg by mouth daily.     Allergies:   Bactrim [sulfamethoxazole-trimethoprim] and Tetracyclines & related   Social History   Socioeconomic History  . Marital status: Married    Spouse name: Not on file  . Number of children: Not on file  . Years of education: Not on file  . Highest education level: Not on file  Occupational History  . Not on file  Tobacco Use  . Smoking status: Never Smoker  . Smokeless tobacco: Never Used  Substance and Sexual Activity  . Alcohol use: Yes    Alcohol/week: 2.0 standard drinks    Types: 2 Glasses of wine per week  . Drug use: Never  . Sexual activity: Not on file  Other Topics Concern  . Not on file  Social History Narrative  . Not  on file   Social Determinants of Health   Financial Resource Strain: Not on file  Food Insecurity: Not on file  Transportation Needs: Not on file  Physical Activity: Not on file  Stress: Not on file  Social Connections: Not on file     Family History: Father had CABG in 17s  ROS:   Please see the history of present illness.     All other systems reviewed and are negative.  EKGs/Labs/Other Studies Reviewed:    The following studies were reviewed today:   EKG:  EKG is  ordered today.  The ekg ordered today demonstrates sinus rhythm, rate 63, no ST/T abnormality  Recent Labs: No results found for requested labs within last 8760 hours.  Recent Lipid Panel No results found for: CHOL, TRIG, HDL, CHOLHDL, VLDL, LDLCALC, LDLDIRECT  Physical Exam:    VS:  BP 127/87   Pulse 63   Ht 5\' 4"  (1.626 m)   Wt 133 lb 12.8 oz (60.7 kg)   BMI 22.97 kg/m     Wt Readings from Last 3 Encounters:  11/23/20 133 lb 12.8 oz (60.7 kg)  07/30/20 134 lb (60.8 kg)  05/29/20 135 lb (61.2 kg)     GEN: Well nourished, well developed in no acute distress HEENT: Normal NECK: No JVD; No carotid bruits LYMPHATICS:  No lymphadenopathy CARDIAC: RRR, no murmurs, rubs, gallops RESPIRATORY:  Clear to auscultation without rales, wheezing or rhonchi  ABDOMEN: Soft, non-tender, non-distended MUSCULOSKELETAL:  No edema; No deformity  SKIN: Warm and dry NEUROLOGIC:  Alert and oriented x 3 PSYCHIATRIC:  Normal affect   ASSESSMENT:    1. Palpitations   2. Chest pain of uncertain etiology    PLAN:     Palpitations: Description concerning for arrhythmia, will evaluate with Zio patch x14 days.  Will check echocardiogram to rule out structural heart disease  Chest pain: Atypical in description, occurs during episodes of palpitations.  Suspect due to arrhythmia as above.  Low suspicion for CAD given lack of risk factors.  She is an active runner, denies any exertional chest pain.  RTC in  94-month   Medication Adjustments/Labs and Tests Ordered: Current medicines are reviewed at length with the patient today.  Concerns regarding medicines are outlined above.  Orders Placed This Encounter  Procedures  . LONG TERM MONITOR (3-14 DAYS)  . EKG 12-Lead  . ECHOCARDIOGRAM COMPLETE   No orders of the defined types were placed in this encounter.   Patient Instructions  Medication Instructions:  Your physician recommends that you continue on your current medications as directed. Please refer to the Current Medication list given to you today.  Testing/Procedures: Your physician has requested that you have an echocardiogram. Echocardiography is a painless test that uses sound waves to create images of your heart. It provides your doctor with information about the size and shape of your heart and how well your heart's chambers and valves are working. This procedure takes approximately one hour. There are no restrictions for this procedure.  This will be done at our [facility] location:  [address]   ZIO XT- Long Term Monitor Instructions   Your physician has requested you wear your ZIO patch monitor 14 days.   This is a single patch monitor.  [employer] supplies one patch monitor per enrollment.  Additional stickers are not available.   Please do not apply patch if you will be having a Nuclear Stress Test, Echocardiogram, Cardiac CT, MRI, or Chest Xray during the time frame you would be wearing the monitor. The patch cannot be worn during these tests.  You cannot remove and re-apply the ZIO XT patch monitor.   Your ZIO patch monitor will be sent USPS Priority mail from [facility] directly to your home address. The monitor may also be mailed to a PO BOX if home delivery is not available.   It may take 3-5 days to receive your monitor after you have been enrolled.   Once you have received you monitor, please review enclosed instructions.  Your monitor  has already been registered assigning a specific monitor serial # to you.   Applying the monitor   Shave hair from upper left chest.   Hold abrader disc by orange tab.  Rub abrader in 40 strokes over left upper chest as indicated in your monitor instructions.   Clean area with 4 enclosed alcohol pads .  Use all pads to assure are is cleaned thoroughly.  Let dry.   Apply patch as indicated in monitor instructions.  Patch will be place under collarbone on left side of chest with arrow pointing upward.   Rub patch adhesive wings for 2 minutes.Remove white label marked "1".  Remove white label marked "2".  Rub patch adhesive wings for 2 additional minutes.   While looking in a  mirror, press and release button in center of patch.  A small green light will flash 3-4 times .  This will be your only indicator the monitor has been turned on.     Do not shower for the first 24 hours.  You may shower after the first 24 hours.   Press button if you feel a symptom. You will hear a small click.  Record Date, Time and Symptom in the Patient Log Book.   When you are ready to remove patch, follow instructions on last 2 pages of Patient Log Book.  Stick patch monitor onto last page of Patient Log Book.   Place Patient Log Book in Elim box.  Use locking tab on box and tape box closed securely.  The [employer] and [employer] has [employer] on it.  Please place in mailbox as soon as possible.  Your physician should have your test results approximately 7 days after the monitor has been mailed back to [facility].   Call [facility] Customer Care at [phone] if you have questions regarding your ZIO XT patch monitor.  Call them immediately if you see an orange light blinking on your monitor.   If your monitor falls off in less than 4 days contact our Monitor department at [phone].  If your monitor becomes loose or falls off after 4 days call [employer] at [phone] for suggestions on securing  your monitor.    Follow-Up: At [facility], you and your health needs are our priority.  As part of our continuing mission to provide you with exceptional heart care, we have created designated Provider Care Teams.  These Care Teams include your primary Cardiologist (physician) and Advanced Practice Providers (APPs -  Physician Assistants and Nurse Practitioners) who all work together to provide you with the care you need, when you need it.  We recommend signing up for the patient portal called "MyChart".  Sign up information is provided on this After Visit Summary.  MyChart is used to connect with patients for Virtual Visits (Telemedicine).  Patients are able to view lab/test results, encounter notes, upcoming appointments, etc.  Non-urgent messages can be sent to your provider as well.   To learn more about what you can do with MyChart, go to [email or web address].    Your next appointment:   3 month(s)  The format for your next appointment:   In Person  Provider:   Epifanio Lesches, MD         Signed, Little Ishikawa, MD  11/25/2020 3:09 PM     [facility]

## 2020-11-26 ENCOUNTER — Encounter: Payer: Self-pay | Admitting: Physical Therapy

## 2020-11-26 ENCOUNTER — Ambulatory Visit: Payer: BC Managed Care – PPO | Attending: Family Medicine | Admitting: Physical Therapy

## 2020-11-26 ENCOUNTER — Other Ambulatory Visit: Payer: Self-pay

## 2020-11-26 DIAGNOSIS — R252 Cramp and spasm: Secondary | ICD-10-CM | POA: Insufficient documentation

## 2020-11-26 DIAGNOSIS — M6281 Muscle weakness (generalized): Secondary | ICD-10-CM | POA: Insufficient documentation

## 2020-11-26 DIAGNOSIS — R278 Other lack of coordination: Secondary | ICD-10-CM | POA: Diagnosis present

## 2020-11-26 NOTE — Patient Instructions (Signed)
Access Code: [email or web address] URL: [email or web address] Date: 11/26/2020 Prepared by: Eulis Foster  Exercises Supine Pelvic Floor Stretch - 1 x daily - 7 x weekly - 1 sets - 2 reps - 30 sec hold Supine Figure 4 Piriformis Stretch with Leg Extension - 1 x daily - 7 x weekly - 1 sets - 2 reps - 30 sec hold Cat Cow - 1 x daily - 7 x weekly - 1 sets - 10 reps V Sit Hip Adductor Hamstring Stretch - 1 x daily - 7 x weekly - 1 sets - 2 reps - 30 sec hold [facility] [address] [place], [place] [address] Phone # [phone] Fax [phone]

## 2020-11-26 NOTE — Therapy (Signed)
[facility] [address] [place], [place], [address] Phone: [phone]   Fax:  [phone]  Physical Therapy Evaluation  Patient Details  Name: Sheri Shannon MRN: [id number] Date of Birth: 1971-02-21 Referring Provider (PT): Dr. Darrick Meigs   Encounter Date: 11/26/2020   PT End of Session - 11/26/20 1536    Visit Number 1    Date for PT Re-Evaluation 02/18/21    Authorization Type BCBS    PT Start Time 1445    PT Stop Time 1530    PT Time Calculation (min) 45 min    Activity Tolerance Patient tolerated treatment well    Behavior During Therapy [facility] for tasks assessed/performed           History reviewed. No pertinent past medical history.  History reviewed. No pertinent surgical history.  There were no vitals filed for this visit.    Subjective Assessment - 11/26/20 1448    Subjective Since she had her second child who is 11 started to have leakage. Patient will have to wear a pad to run. The leakage is getting worse.    Patient Stated Goals improve urinary leakage    Currently in Pain? No/denies    Multiple Pain Sites No              [facility] PT Assessment - 11/26/20 0001      Assessment   Medical Diagnosis N39.3 Primary Stress urinary incontinence    Referring Provider (PT) Dr. Darrick Meigs    Onset Date/Surgical Date --   9 years ago   Prior Therapy none      Precautions   Precautions None      Restrictions   Weight Bearing Restrictions No      Balance Screen   Has the patient fallen in the past 6 months No    Has the patient had a decrease in activity level because of a fear of falling?  No    Is the patient reluctant to leave their home because of a fear of falling?  No      Prior Function   Level of Independence Independent    Vocation Requirements professor    Leisure on weekend 1 8-9 mile run; during the week 3 miles      Cognition   Overall Cognitive Status Within  Functional Limits for tasks assessed      Posture/Postural Control   Posture/Postural Control No significant limitations      ROM / Strength   AROM / PROM / Strength AROM;PROM;Strength      AROM   Overall AROM Comments lumbar ROM      Strength   Right Hip ABduction 4/5    Left Hip Flexion 4/5    Left Hip Extension 4/5    Left Hip ABduction 4/5      Palpation   SI assessment  ASIS                      Objective measurements completed on examination: See above findings.     Pelvic Floor Special Questions - 11/26/20 0001    Prior Pregnancies Yes    Number of Pregnancies 6    Number of Vaginal Deliveries 2    Episiotomy Performed Yes   both   Currently Sexually Active Yes    Is this Painful No    Urinary Leakage Yes    Pad use 1 with running   sometimes soaks a pad  Activities that cause leaking Running;Coughing;Sneezing;Other    Other activities that cause leaking jumping    Fecal incontinence No    Caffeine beverages Drinks coffee in the morning, runs before the coffee    Skin Integrity Intact    Perineal Body/Introitus  Other   tight   Prolapse Anterior Wall   very small   Pelvic Floor Internal Exam Patient confirms identification and approves PT to assess pelvic floor and treatment    Exam Type Vaginal    Palpation tightness along the introitus, perineal body and superior transverse    Strength weak squeeze, no lift            [facility] Adult PT Treatment/Exercise - 11/26/20 0001      Lumbar Exercises: Stretches   Piriformis Stretch Right;Left;1 rep    Piriformis Stretch Limitations supine    Other Lumbar Stretch Exercise Happy baby stretch    Other Lumbar Stretch Exercise sitting hip adductor stretch holding for 30 sec each      Lumbar Exercises: Quadruped   Madcat/Old Horse 10 reps      Manual Therapy   Manual therapy comments educated patient on how to perfrom manual mobilization to the perineal body, superior transverse, and sides of the  introitus                  PT Education - 11/26/20 1535    Education Details Access Code: [email or web address]; perineal massage    Person(s) Educated Patient    Methods Explanation;Demonstration;Verbal cues;Handout    Comprehension Returned demonstration;Verbalized understanding            PT Short Term Goals - 11/26/20 1545      PT SHORT TERM GOAL #1   Title independent with initial HEP for stretches and pelvic floor strength    Time 4    Period Weeks    Status New    Target Date 12/24/20             PT Long Term Goals - 11/26/20 1546      PT LONG TERM GOAL #1   Title independent with advanced HEP for core and pelvic floor strength    Time 12    Period Weeks    Status New    Target Date 02/18/21      PT LONG TERM GOAL #2   Title able to run with minimal to no urinary leakage and does not have to wear a pad due to increased pelvic floor strength    Time 12    Period Weeks    Status New    Target Date 02/18/21      PT LONG TERM GOAL #3   Title urinary leakage with coughing and sneezing decreased >/= 80% due to improved pelvic floor coordination    Time 12    Period Weeks    Status New    Target Date 02/18/21      PT LONG TERM GOAL #4   Title able to jump with no leakage due to increased pelvic floor strength >/= 3/5    Time 12    Period Weeks    Status New    Target Date 02/18/21                  Plan - 11/26/20 1539    Clinical Impression Statement Patient is a 50 year old female with stress incontinence for the past 9 years and is getting progressively worse. Patient reports her urinary leakage happens with running,  coughing, sneezing, and jumping. Patient has to wear a pad with running. Pelvic floor strength is 2/5 with no lift. She has tightness in the perineal body, superior transverse, and sides of the introitus. Bilateral hip strength averages 4/5. Pelvis in correct alignment. She has full lumbar ROM. Patient will benefit from skilled therapy to  improve circular contraction of the pelvic floor to reduce urinary leakage.    Personal Factors and Comorbidities Fitness;Time since onset of injury/illness/exacerbation    Examination-Activity Limitations Continence;Locomotion Level    Stability/Clinical Decision Making Stable/Uncomplicated    Clinical Decision Making Low    Rehab Potential Excellent    PT Frequency 1x / week    PT Duration 12 weeks    PT Treatment/Interventions ADLs/Self Care Home Management;Biofeedback;Electrical Stimulation;Neuromuscular re-education;Therapeutic exercise;Therapeutic activities;Patient/family education;Manual techniques;Dry needling    PT Next Visit Plan work on rib mobility and diaphragmatic breathing; manual work to the pelvic floor; bladder irritants; hip strength    PT Home Exercise Plan Access Code: [email or web address]    Consulted and Agree with Plan of Care Patient           Patient will benefit from skilled therapeutic intervention in order to improve the following deficits and impairments:  Decreased coordination,Increased fascial restricitons,Increased muscle spasms,Decreased endurance,Decreased activity tolerance,Decreased strength  Visit Diagnosis: Muscle weakness (generalized) - Plan: PT plan of care cert/re-cert  Other lack of coordination - Plan: PT plan of care cert/re-cert  Cramp and spasm - Plan: PT plan of care cert/re-cert     Problem List Patient Active Problem List   Diagnosis Date Noted  . Finger injury 07/30/2020  . AC joint pain 01/06/2020  . Right wrist pain 07/02/2016    Eulis Foster, PT 11/26/20 3:51 PM   [facility] [address] [place], [place], [address] Phone: [phone]   Fax:  [phone]  Name: Sheri Shannon MRN: [id number] Date of Birth: [birth year]

## 2020-11-28 DIAGNOSIS — R002 Palpitations: Secondary | ICD-10-CM

## 2020-12-13 ENCOUNTER — Encounter: Payer: Self-pay | Admitting: Physical Therapy

## 2020-12-13 ENCOUNTER — Other Ambulatory Visit: Payer: Self-pay

## 2020-12-13 ENCOUNTER — Ambulatory Visit: Payer: BC Managed Care – PPO | Admitting: Physical Therapy

## 2020-12-13 DIAGNOSIS — M6281 Muscle weakness (generalized): Secondary | ICD-10-CM

## 2020-12-13 DIAGNOSIS — R252 Cramp and spasm: Secondary | ICD-10-CM

## 2020-12-13 DIAGNOSIS — R278 Other lack of coordination: Secondary | ICD-10-CM

## 2020-12-13 NOTE — Patient Instructions (Addendum)
Certain foods and liquids will decrease the pH making the urine more acidic.  Urinary urgency increases when the urine has a low pH.  Most common irritants: alcohol, carbonated beverages and caffinated beverages.  Foods to avoid: apple juice, apples, ascorbic acid, canteloupes, chili, citrus fruits, coffee, cranberries, grapes, guava, peaches, pepper, pineapple, plums, strawberries, tea, tomatoes, and vinegar.  Drinking plenty of water may help to increase the pH and dilute out any of the effects of specific irritants.  Foods that are NOT irritating to the bladder include: Pears, papayas, sun-brewed teas, watermelons, non-citrus herbal teas, apricots, kava and low-acid instant drinks (Postum)  Access Code: [email or web address] URL: [email or web address] Date: 12/13/2020 Prepared by: Eulis Foster  Exercises Supine Pelvic Floor Stretch - 1 x daily - 7 x weekly - 1 sets - 2 reps - 30 sec hold Supine Figure 4 Piriformis Stretch with Leg Extension - 1 x daily - 7 x weekly - 1 sets - 2 reps - 30 sec hold Cat Cow - 1 x daily - 7 x weekly - 1 sets - 10 reps V Sit Hip Adductor Hamstring Stretch - 1 x daily - 7 x weekly - 1 sets - 2 reps - 30 sec hold Lunge with Anchored Lateral Resistance - 1 x daily - 7 x weekly - 1 sets - 10 reps Skip with High Knees - 1 x daily - 7 x weekly - 1 sets - 10 reps 3-Way Lunge on Slider - 1 x daily - 7 x weekly - 1 sets - 5 reps [facility] [address] [place], [place] [address] Phone # [phone] Fax [phone]

## 2020-12-13 NOTE — Therapy (Signed)
[facility] [address] [place], [place], [address] Phone: [phone]   Fax:  [phone]  Physical Therapy Treatment  Patient Details  Name: Sheri Shannon MRN: [id number] Date of Birth: 05/28/1971 Referring Provider (PT): Dr. Darrick Meigs   Encounter Date: 12/13/2020   PT End of Session - 12/13/20 2050    Visit Number 2    Date for PT Re-Evaluation 02/18/21    Authorization Type BCBS    PT Start Time 1530    PT Stop Time 1608    PT Time Calculation (min) 38 min    Activity Tolerance Patient tolerated treatment well    Behavior During Therapy [facility] for tasks assessed/performed           History reviewed. No pertinent past medical history.  History reviewed. No pertinent surgical history.  There were no vitals filed for this visit.   Subjective Assessment - 12/13/20 1535    Subjective I felt good about the eval.    Patient Stated Goals improve urinary leakage    Currently in Pain? No/denies    Multiple Pain Sites No                             [facility] Adult PT Treatment/Exercise - 12/13/20 0001      Self-Care   Self-Care Other Self-Care Comments    Other Self-Care Comments  education on bladder irritants and how they affect the bladder.      Neuro Re-ed    Neuro Re-ed Details  diaphragmatic breathing with expansion of the lower rib cage into the abdomen to relax the pelvic floor but patient was having difficulty so changed position to 90/90 with therapist palpating pelvic floor through her cloths. Patient was then able to feel the pelvic floor to drop.      Knee/Hip Exercises: Standing   Forward Lunges Limitations backward lunge 5 time each way while holding the holding the greenband    SLS single leg stance lunging in three vectors while holding onto the greenband and contracting the pelvic floor    Other Standing Knee Exercises 1/2 kneel pulling greenband across chest and other  greenband is pulling the forward knee outward to contract the pelvic floor 10x each way    Other Standing Knee Exercises running pattern with upright posture and not crunching forward to put pressure on her bladder                  PT Education - 12/13/20 2049    Education Details Access Code: [email or web address]; bladder irritants    Person(s) Educated Patient    Methods Explanation;Demonstration;Handout    Comprehension Returned demonstration;Verbalized understanding            PT Short Term Goals - 11/26/20 1545      PT SHORT TERM GOAL #1   Title independent with initial HEP for stretches and pelvic floor strength    Time 4    Period Weeks    Status New    Target Date 12/24/20             PT Long Term Goals - 11/26/20 1546      PT LONG TERM GOAL #1   Title independent with advanced HEP for core and pelvic floor strength    Time 12    Period Weeks    Status New    Target Date 02/18/21      PT LONG TERM  GOAL #2   Title able to run with minimal to no urinary leakage and does not have to wear a pad due to increased pelvic floor strength    Time 12    Period Weeks    Status New    Target Date 02/18/21      PT LONG TERM GOAL #3   Title urinary leakage with coughing and sneezing decreased >/= 80% due to improved pelvic floor coordination    Time 12    Period Weeks    Status New    Target Date 02/18/21      PT LONG TERM GOAL #4   Title able to jump with no leakage due to increased pelvic floor strength >/= 3/5    Time 12    Period Weeks    Status New    Target Date 02/18/21                 Plan - 12/13/20 2051    Clinical Impression Statement Patient will leak on her runs that are long and on a trail. Patient has been doing her manual work and had one time of leakage. This may be due to the muscles have lengthen and now need to be stronger. Patient has trouble being aware of her pelvic floor contraction and tightness. Patient was able to feel her pelvic  floor relax by the end of practicing her diaphragmatic breathing.  Patients exercise are to facilitate her running, increase her core strength and pelvic floor contraction.  Patient will benefit from skilled therapy to improve pelvic floor strength to reduce leakage with running.    Personal Factors and Comorbidities Fitness;Time since onset of injury/illness/exacerbation    Examination-Activity Limitations Continence;Locomotion Level    Stability/Clinical Decision Making Stable/Uncomplicated    Rehab Potential Excellent    PT Frequency 1x / week    PT Duration 12 weeks    PT Treatment/Interventions ADLs/Self Care Home Management;Biofeedback;Electrical Stimulation;Neuromuscular re-education;Therapeutic exercise;Therapeutic activities;Patient/family education;Manual techniques;Dry needling    PT Next Visit Plan see if need to do internal work to improve tissue mobility; standing quick flick while palpating the pelvic floor, supine SLR abominal work; plank with hip abduction    PT Home Exercise Plan Access Code: [email or web address]    Recommended Other Services MD signed initial note    Consulted and Agree with Plan of Care Patient           Patient will benefit from skilled therapeutic intervention in order to improve the following deficits and impairments:  Decreased coordination,Increased fascial restricitons,Increased muscle spasms,Decreased endurance,Decreased activity tolerance,Decreased strength  Visit Diagnosis: Muscle weakness (generalized)  Other lack of coordination  Cramp and spasm     Problem List Patient Active Problem List   Diagnosis Date Noted  . Finger injury 07/30/2020  . AC joint pain 01/06/2020  . Right wrist pain 07/02/2016    Sheri Shannon, PT 12/13/20 8:56 PM    [facility] [address] [place], [place], [address] Phone: [phone]   Fax:  [phone]  Name: Sheri Shannon MRN: [id number] Date of  Birth: 14-Jan-1971

## 2020-12-18 ENCOUNTER — Ambulatory Visit (HOSPITAL_COMMUNITY): Payer: BC Managed Care – PPO | Attending: Cardiology

## 2020-12-18 ENCOUNTER — Other Ambulatory Visit: Payer: Self-pay

## 2020-12-18 ENCOUNTER — Other Ambulatory Visit (HOSPITAL_COMMUNITY): Payer: BC Managed Care – PPO

## 2020-12-18 DIAGNOSIS — R002 Palpitations: Secondary | ICD-10-CM | POA: Diagnosis present

## 2020-12-18 LAB — ECHOCARDIOGRAM COMPLETE
Area-P 1/2: 4.31 cm2
S' Lateral: 3.1 cm

## 2020-12-27 ENCOUNTER — Other Ambulatory Visit: Payer: Self-pay

## 2020-12-27 ENCOUNTER — Ambulatory Visit: Payer: BC Managed Care – PPO | Attending: Family Medicine | Admitting: Physical Therapy

## 2020-12-27 ENCOUNTER — Encounter: Payer: Self-pay | Admitting: Physical Therapy

## 2020-12-27 DIAGNOSIS — R278 Other lack of coordination: Secondary | ICD-10-CM | POA: Diagnosis present

## 2020-12-27 DIAGNOSIS — M6281 Muscle weakness (generalized): Secondary | ICD-10-CM | POA: Insufficient documentation

## 2020-12-27 DIAGNOSIS — R252 Cramp and spasm: Secondary | ICD-10-CM | POA: Insufficient documentation

## 2020-12-27 NOTE — Patient Instructions (Addendum)
Access Code: [email or web address] URL: [email or web address] Date: 12/27/2020 Prepared by: Eulis Foster  Exercises Supine Pelvic Floor Stretch - 1 x daily - 7 x weekly - 1 sets - 2 reps - 30 sec hold Supine Figure 4 Piriformis Stretch with Leg Extension - 1 x daily - 7 x weekly - 1 sets - 2 reps - 30 sec hold Cat Cow - 1 x daily - 7 x weekly - 1 sets - 10 reps V Sit Hip Adductor Hamstring Stretch - 1 x daily - 7 x weekly - 1 sets - 2 reps - 30 sec hold Lunge with Anchored Lateral Resistance - 1 x daily - 7 x weekly - 1 sets - 10 reps Skip with High Knees - 1 x daily - 7 x weekly - 1 sets - 10 reps 3-Way Lunge on Slider - 1 x daily - 7 x weekly - 1 sets - 5 reps Seated Pelvic Floor Contraction - 3 x daily - 7 x weekly - 1 sets - 5 reps - 10 sec hold Hip Flexor Mobilization with Foam Roll - 1 x daily - 7 x weekly - 3 sets - 10 reps Sidelying IT Band Foam Roll Mobilization - 1 x daily - 7 x weekly - 3 sets - 10 reps Anterior Tibialis and Peroneal Mobilization with Foam Roll - 1 x daily - 7 x weekly - 3 sets - 10 reps Piriformis Mobilization on Foam Roll - 1 x daily - 7 x weekly - 3 sets - 10 reps Hamstring Mobilization with Foam Roll - 1 x daily - 7 x weekly - 3 sets - 10 reps Gluteus Mobilization with Foam Roll - 1 x daily - 7 x weekly - 3 sets - 10 reps Adductor Mobilization with Foam Roll - 1 x daily - 7 x weekly - 3 sets - 10 reps [facility] [address], [address] [place], [place] [address] Phone # [phone] Fax [phone]  Trigger Point Dry Needling  . What is Trigger Point Dry Needling (DN)? o DN is a physical therapy technique used to treat muscle pain and dysfunction. Specifically, DN helps deactivate muscle trigger points (muscle knots).  o A thin filiform needle is used to penetrate the skin and stimulate the underlying trigger point. The goal is for a local twitch response (LTR) to occur and for the trigger point to relax. No medication of any kind is  injected during the procedure.   . What Does Trigger Point Dry Needling Feel Like?  o The procedure feels different for each individual patient. Some patients report that they do not actually feel the needle enter the skin and overall the process is not painful. Very mild bleeding may occur. However, many patients feel a deep cramping in the muscle in which the needle was inserted. This is the local twitch response.   Marland Kitchen How Will I feel after the treatment? o Soreness is normal, and the onset of soreness may not occur for a few hours. Typically this soreness does not last longer than two days.  o Bruising is uncommon, however; ice can be used to decrease any possible bruising.  o In rare cases feeling tired or nauseous after the treatment is normal. In addition, your symptoms may get worse before they get better, this period will typically not last longer than 24 hours.   . What Can I do After My Treatment? o Increase your hydration by drinking more water for the next 24 hours. o You may place ice or  heat on the areas treated that have become sore, however, do not use heat on inflamed or bruised areas. Heat often brings more relief post needling. o You can continue your regular activities, but vigorous activity is not recommended initially after the treatment for 24 hours. o DN is best combined with other physical therapy such as strengthening, stretching, and other therapies.    [facility] [address], [address] [place], [place] [address] Phone # [phone] Fax [phone]

## 2020-12-27 NOTE — Therapy (Addendum)
[facility] [address], [address] [place], [place], [address] Phone: [phone]   Fax:  [phone]  Physical Therapy Treatment  Patient Details  Name: Sheri Shannon MRN: [id number] Date of Birth: Sep 01, 1971 Referring Provider (PT): Dr. Darrick Meigs   Encounter Date: 12/27/2020   PT End of Session - 12/27/20 0854    Visit Number 3    Date for PT Re-Evaluation 02/18/21    Authorization Type BCBS    PT Start Time 0805    PT Stop Time 0846    PT Time Calculation (min) 41 min    Activity Tolerance Patient tolerated treatment well    Behavior During Therapy [facility] for tasks assessed/performed           History reviewed. No pertinent past medical history.  History reviewed. No pertinent surgical history.  There were no vitals filed for this visit.   Subjective Assessment - 12/27/20 0811    Subjective I have been doing more plyometrics. My pad is not fully wet now.    Patient Stated Goals improve urinary leakage    Currently in Pain? No/denies    Multiple Pain Sites No                          Pelvic Floor Special Questions - 12/27/20 0001    Pelvic Floor Internal Exam Patient confirms identification and approves PT to assess pelvic floor and treatment    Exam Type Vaginal    Strength fair squeeze, definite lift             [facility] Adult PT Treatment/Exercise - 12/27/20 0001      Exercises   Exercises Other Exercises    Other Exercises  discussed foam rolling of the leg muscles. pelvic floor contraction in supine with full relaxation for 5 seconds and 10 seconds      Manual Therapy   Manual Therapy Soft tissue mobilization;Internal Pelvic Floor         manual work to assess for dry needling, manual mobilization to bilateral hip adductors. Internal work to the levator ani and obturator internist, perineal body, and superficial transverse.  Eulis Foster, PT 01/24/21 11:47 AM     Trigger  Point Dry Needling - 12/27/20 0001    Consent Given? Yes    Education Handout Provided Yes    Muscles Treated Lower Quadrant Hamstring;Adductor longus/brevis/magnus    Adductor Response Palpable increased muscle length;Twitch response elicited   bilateral   Hamstring Response Twitch response elicited;Palpable increased muscle length   left               PT Education - 12/27/20 0852    Education Details Access Code: [email or web address]; information on dry needling    Person(s) Educated Patient    Methods Explanation;Demonstration;Verbal cues;Handout    Comprehension Returned demonstration;Verbalized understanding            PT Short Term Goals - 12/27/20 1144      PT SHORT TERM GOAL #1   Title independent with initial HEP for stretches and pelvic floor strength    Time 4    Period Weeks    Status Achieved             PT Long Term Goals - 11/26/20 1546      PT LONG TERM GOAL #1   Title independent with advanced HEP for core and pelvic floor strength    Time 12    Period Weeks  Status New    Target Date 02/18/21      PT LONG TERM GOAL #2   Title able to run with minimal to no urinary leakage and does not have to wear a pad due to increased pelvic floor strength    Time 12    Period Weeks    Status New    Target Date 02/18/21      PT LONG TERM GOAL #3   Title urinary leakage with coughing and sneezing decreased >/= 80% due to improved pelvic floor coordination    Time 12    Period Weeks    Status New    Target Date 02/18/21      PT LONG TERM GOAL #4   Title able to jump with no leakage due to increased pelvic floor strength >/= 3/5    Time 12    Period Weeks    Status New    Target Date 02/18/21                 Plan - 12/27/20 0854    Clinical Impression Statement Patient pelvic floor strength has increased to 3/5. She has increased mobility of the pelvic floor tissue. Patient had trigger points in the hip adductors and left hamstring pulling on the  pelvis and increased tightenss of the pelvic floor. Patient has a reduction of urinary leakage while running. Patient is more aware of her pelvic floor muscles. Patient will benefit from skilled therapy to improve pelvic floor strength to reduce leakage with running.    Personal Factors and Comorbidities Fitness;Time since onset of injury/illness/exacerbation    Examination-Activity Limitations Continence;Locomotion Level    Stability/Clinical Decision Making Stable/Uncomplicated    Rehab Potential Excellent    PT Frequency 1x / week    PT Duration 12 weeks    PT Treatment/Interventions ADLs/Self Care Home Management;Biofeedback;Electrical Stimulation;Neuromuscular re-education;Therapeutic exercise;Therapeutic activities;Patient/family education;Manual techniques;Dry needling    PT Next Visit Plan internal work to improve tissue mobility; standing quick flick while palpating the pelvic floor, supine SLR abominal work; plank with hip abduction; dry needling    PT Home Exercise Plan Access Code: [email or web address]    Consulted and Agree with Plan of Care Patient           Patient will benefit from skilled therapeutic intervention in order to improve the following deficits and impairments:  Decreased coordination,Increased fascial restricitons,Increased muscle spasms,Decreased endurance,Decreased activity tolerance,Decreased strength  Visit Diagnosis: Muscle weakness (generalized)  Other lack of coordination  Cramp and spasm     Problem List Patient Active Problem List   Diagnosis Date Noted  . Finger injury 07/30/2020  . AC joint pain 01/06/2020  . Right wrist pain 07/02/2016    Eulis Foster, PT 12/27/20 11:46 AM   [facility] [address] [place], [place], [address] Phone: [phone]   Fax:  [phone]  Name: Sheri Shannon MRN: [id number] Date of Birth: 1971/02/01

## 2021-01-07 ENCOUNTER — Telehealth: Payer: Self-pay | Admitting: Physical Therapy

## 2021-01-07 ENCOUNTER — Ambulatory Visit: Payer: BC Managed Care – PPO | Admitting: Physical Therapy

## 2021-01-07 NOTE — Telephone Encounter (Signed)
Called patient about her missed appointment today at 14:00. Left a message. Eulis Foster, PT @4 /25/2022@ 2:19 PM

## 2021-01-24 ENCOUNTER — Encounter: Payer: Self-pay | Admitting: Physical Therapy

## 2021-01-24 ENCOUNTER — Ambulatory Visit: Payer: BC Managed Care – PPO | Attending: Family Medicine | Admitting: Physical Therapy

## 2021-01-24 ENCOUNTER — Other Ambulatory Visit: Payer: Self-pay

## 2021-01-24 DIAGNOSIS — M6281 Muscle weakness (generalized): Secondary | ICD-10-CM | POA: Diagnosis not present

## 2021-01-24 DIAGNOSIS — R252 Cramp and spasm: Secondary | ICD-10-CM | POA: Diagnosis present

## 2021-01-24 DIAGNOSIS — R278 Other lack of coordination: Secondary | ICD-10-CM

## 2021-01-24 NOTE — Patient Instructions (Signed)
Access Code: [email or web address] URL: [email or web address] Date: 01/24/2021 Prepared by: Eulis Foster  Program Notes resistance- red is easier than green, than the hardest is blue  Monster walk with red band around thighs in a semi squat forward and backward.     Exercises Supine Pelvic Floor Stretch - 1 x daily - 7 x weekly - 1 sets - 2 reps - 30 sec hold Supine Figure 4 Piriformis Stretch with Leg Extension - 1 x daily - 7 x weekly - 1 sets - 2 reps - 30 sec hold Cat Cow - 1 x daily - 7 x weekly - 1 sets - 10 reps V Sit Hip Adductor Hamstring Stretch - 1 x daily - 7 x weekly - 1 sets - 2 reps - 30 sec hold Lunge with Anchored Lateral Resistance - 1 x daily - 7 x weekly - 1 sets - 10 reps Skip with High Knees - 1 x daily - 7 x weekly - 1 sets - 10 reps 3-Way Lunge on Slider - 1 x daily - 7 x weekly - 1 sets - 5 reps Seated Pelvic Floor Contraction - 3 x daily - 7 x weekly - 1 sets - 5 reps - 10 sec hold Hip Flexor Mobilization with Foam Roll - 1 x daily - 7 x weekly - 3 sets - 10 reps Sidelying IT Band Foam Roll Mobilization - 1 x daily - 7 x weekly - 3 sets - 10 reps Anterior Tibialis and Peroneal Mobilization with Foam Roll - 1 x daily - 7 x weekly - 3 sets - 10 reps Piriformis Mobilization on Foam Roll - 1 x daily - 7 x weekly - 3 sets - 10 reps Hamstring Mobilization with Foam Roll - 1 x daily - 7 x weekly - 3 sets - 10 reps Gluteus Mobilization with Foam Roll - 1 x daily - 7 x weekly - 3 sets - 10 reps Adductor Mobilization with Foam Roll - 1 x daily - 7 x weekly - 3 sets - 10 reps Modified Side Plank with Hip Abduction and Resistance - 1 x daily - 2 x weekly - 1 sets - 10 reps Single Leg Bridge with Resistance Loop - 1 x daily - 2 x weekly - 1 sets - 10 reps Single Leg Squat with Chair Touch - 1 x daily - 2 x weekly - 1 sets - 10 reps Single Leg Squat with Resistance Loop - 1 x daily - 2 x weekly - 1 sets - 10 reps Standing Heel Raise with Hip and Knee Flexion at Wall - 1 x  daily - 2 x weekly - 1 sets - 10 reps Lateral Monster Walk with Squat and Resistance (BKA) - 1 x daily - 7 x weekly - 3 sets - 10 reps  Patient Education Trigger [facility] Dry Needling  [facility] [address], [address] [place], [place] [address] Phone # [phone] Fax [phone]

## 2021-01-24 NOTE — Therapy (Signed)
[facility] [address] [place], [place], [address] Phone: [phone]   Fax:  [phone]  Physical Therapy Treatment  Patient Details  Name: Sheri Shannon MRN: [id number] Date of Birth: [birth year] Referring Provider (PT): Dr. Kara Pacer   Encounter Date: 01/24/2021   PT End of Session - 01/24/21 0842    Visit Number 4    Date for PT Re-Evaluation 02/18/21    Authorization Type BCBS    PT Start Time 0800    PT Stop Time 0838    PT Time Calculation (min) 38 min    Activity Tolerance Patient tolerated treatment well    Behavior During Therapy [facility] for tasks assessed/performed           History reviewed. No pertinent past medical history.  History reviewed. No pertinent surgical history.  There were no vitals filed for this visit.   Subjective Assessment - 01/24/21 0805    Subjective I have been doing the exercises regularly. I have not noticed leaking at running.    Patient Stated Goals improve urinary leakage    Currently in Pain? No/denies              [facility] PT Assessment - 01/24/21 0001      Assessment   Medical Diagnosis N39.3 Primary Stress urinary incontinence    Referring Provider (PT) Dr. Kara Pacer    Onset Date/Surgical Date --   9 years ago   Prior Therapy none      Precautions   Precautions None      Restrictions   Weight Bearing Restrictions No      Prior Function   Level of Independence Independent    Vocation Requirements professor    Leisure on weekend 1 8-9 mile run; during the week 3 miles      Cognition   Overall Cognitive Status Within Functional Limits for tasks assessed      Posture/Postural Control   Posture/Postural Control No significant limitations      AROM   Overall AROM Comments lumbar ROM is full      Strength   Right Hip ABduction 5/5    Left Hip Flexion 5/5    Left Hip Extension 4+/5    Left Hip ABduction 4+/5      Palpation   SI  assessment  ASIS are equal                      Pelvic Floor Special Questions - 01/24/21 0001    Urinary Leakage No    Strength fair squeeze, definite lift             [facility] Adult PT Treatment/Exercise - 01/24/21 0001      Lumbar Exercises: Supine   Bridge with March 10 reps;1 second    Bridge with [employer] Limitations each side with red banc around knees and holding red band at chest height    Single Leg Bridge 10 reps;1 second    Bridge with [employer] Limitations each side with red band around knees    Other Supine Lumbar Exercises side plank with hip abduction with red band around knees keeping spinal neutral and red band around the knees 10x each side      Knee/Hip Exercises: Standing   Heel Raises Right;Left;1 set;10 reps    Heel Raises Limitations then in running lunge with hands on wall working the soleus and gastroc    SLS single leg squat with red  band around knees and not 10 x each way    Other Standing Knee Exercises monater walk with red band around knees forward and backward                  PT Education - 01/24/21 0839    Education Details Access Code: [email or web address]    Person(s) Educated Patient    Methods Explanation;Demonstration;Verbal cues;Handout    Comprehension Returned demonstration;Verbalized understanding            PT Short Term Goals - 01/24/21 0843      PT SHORT TERM GOAL #1   Title independent with initial HEP for stretches and pelvic floor strength    Time 4    Period Weeks    Status Achieved    Target Date 12/24/20             PT Long Term Goals - 01/24/21 0843      PT LONG TERM GOAL #1   Title independent with advanced HEP for core and pelvic floor strength    Time 12    Period Weeks    Status Achieved      PT LONG TERM GOAL #2   Title able to run with minimal to no urinary leakage and does not have to wear a pad due to increased pelvic floor strength    Time 12    Period Weeks    Status Achieved       PT LONG TERM GOAL #3   Title urinary leakage with coughing and sneezing decreased >/= 80% due to improved pelvic floor coordination    Time 12    Period Weeks    Status Achieved      PT LONG TERM GOAL #4   Title able to jump with no leakage due to increased pelvic floor strength >/= 3/5    Time 12    Period Weeks    Status Achieved                 Plan - 01/24/21 8099    Clinical Impression Statement Patient is not leaking with running. She has increased strength in hips. Her pelvic floor strength is 3/5. Patient has an extensive HEP that will challenge the whole body and understands how to progress herself. She is not leaking urine with jumping, laughing and coughing. Patient is ready for discharge.    Personal Factors and Comorbidities Fitness;Time since onset of injury/illness/exacerbation    Examination-Activity Limitations Continence;Locomotion Level    Stability/Clinical Decision Making Stable/Uncomplicated    Rehab Potential Excellent    PT Treatment/Interventions ADLs/Self Care Home Management;Biofeedback;Electrical Stimulation;Neuromuscular re-education;Therapeutic exercise;Therapeutic activities;Patient/family education;Manual techniques;Dry needling    PT Next Visit Plan Discharge to HEP    PT Home Exercise Plan Access Code: [email or web address]    Consulted and Agree with Plan of Care Patient           Patient will benefit from skilled therapeutic intervention in order to improve the following deficits and impairments:  Decreased coordination,Increased fascial restricitons,Increased muscle spasms,Decreased endurance,Decreased activity tolerance,Decreased strength  Visit Diagnosis: Muscle weakness (generalized)  Other lack of coordination  Cramp and spasm     Problem List Patient Active Problem List   Diagnosis Date Noted  . Finger injury 07/30/2020  . AC joint pain 01/06/2020  . Right wrist pain 07/02/2016    Earlie Counts, PT 01/24/21 8:45 AM   [facility] [address], [place], [place], [address] Phone: [phone]  Fax:  [phone]  Name: Sheri Shannon MRN: [id number] Date of Birth: [birth year]  PHYSICAL THERAPY DISCHARGE SUMMARY  Visits from Start of Care: 4  Current functional level related to goals / functional outcomes: See above.    Remaining deficits: See above.    Education / Equipment: HEP Plan: Patient agrees to discharge.  Patient goals were met. Patient is being discharged due to meeting the stated rehab goals. Thank you for the referral. Earlie Counts, PT 01/24/21 8:46 AM   ?????

## 2021-02-04 ENCOUNTER — Encounter (HOSPITAL_BASED_OUTPATIENT_CLINIC_OR_DEPARTMENT_OTHER): Payer: Self-pay | Admitting: Obstetrics and Gynecology

## 2021-02-04 ENCOUNTER — Other Ambulatory Visit: Payer: Self-pay

## 2021-02-04 ENCOUNTER — Emergency Department (HOSPITAL_BASED_OUTPATIENT_CLINIC_OR_DEPARTMENT_OTHER)
Admission: EM | Admit: 2021-02-04 | Discharge: 2021-02-04 | Disposition: A | Discharge status: Home / Self Care | Payer: BC Managed Care – PPO | Attending: Emergency Medicine | Admitting: Emergency Medicine

## 2021-02-04 DIAGNOSIS — Y9316 Activity, rowing, canoeing, kayaking, rafting and tubing: Secondary | ICD-10-CM | POA: Insufficient documentation

## 2021-02-04 DIAGNOSIS — S060X0A Concussion without loss of consciousness, initial encounter: Secondary | ICD-10-CM | POA: Diagnosis not present

## 2021-02-04 DIAGNOSIS — W228XXA Striking against or struck by other objects, initial encounter: Secondary | ICD-10-CM | POA: Diagnosis not present

## 2021-02-04 DIAGNOSIS — S0990XA Unspecified injury of head, initial encounter: Secondary | ICD-10-CM | POA: Diagnosis present
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
## 2021-02-04 NOTE — Discharge Instructions (Signed)
If you develop new or worsening headache, nausea, vomiting, vision changes, weakness or numbness in her extremities, or any other new/concerning symptoms then return to the ER for evaluation.

## 2021-02-06 ENCOUNTER — Encounter: Payer: BC Managed Care – PPO | Admitting: Physical Therapy

## 2021-02-20 ENCOUNTER — Encounter: Payer: BC Managed Care – PPO | Admitting: Physical Therapy

## 2021-02-24 NOTE — Progress Notes (Signed)
Cardiology Office Note:    Date:  02/26/2021   ID:  Sheri Shannon, DOB [birth year], MRN [id number]  PCP:  Shon Hale, MD  Cardiologist:  None  Electrophysiologist:  None   Referring MD: No ref. provider found   No chief complaint on file.   History of Present Illness:    Sheri Shannon is a 50 y.o. female with a hx of anxiety who presents for follow-up.  She was referred by Dr. Chanetta Marshall for evaluation of chest pain, initially seen on 11/23/2020.  She reports that she has been having episodes where her heart is fluttering, associated with chest tightness.  She is very active, runs 8 miles 1 day a week and then 3 miles another 2-3 times per week.  She denies any symptoms with this.  Reports during episodes of heart fluttering will have chest tightness, can last about 15 minutes.  She denies any lightheadedness, syncope, lower extremity edema.  No smoking history.  Family history includes father had CABG in 50s despite being marathon runner.  Echocardiogram on 12/18/2020 showed normal biventricular function, no significant valvular disease.  Zio patch x14 days on 12/18/2020 showed no significant abnormalities.  Since last clinic visit, she reports that she is doing well.  States that her palpitations have improved.  Occurring every couple weeks, lasting for 5 minutes or so.  She does feel chest tightness with palpitations.  She runs 3-4 times per week and does kayaking.  Denies any exertional chest pain or dyspnea.  No past medical history on file.  Past Surgical History:  Procedure Laterality Date   APPENDECTOMY      Current Medications: Current Meds  Medication Sig   traZODone (DESYREL) 50 MG tablet Take 25 mg by mouth at bedtime as needed.   venlafaxine XR (EFFEXOR-XR) 150 MG 24 hr capsule Take 150 mg by mouth daily.     Allergies:   Bactrim [sulfamethoxazole-trimethoprim] and Tetracyclines & related   Social History   Socioeconomic History   Marital status: Married     Spouse name: Not on file   Number of children: Not on file   Years of education: Not on file   Highest education level: Not on file  Occupational History   Not on file  Tobacco Use   Smoking status: Never   Smokeless tobacco: Never  Vaping Use   Vaping Use: Never used  Substance and Sexual Activity   Alcohol use: Yes    Alcohol/week: 2.0 standard drinks    Types: 2 Glasses of wine per week    Comment: Social   Drug use: Never   Sexual activity: Yes  Other Topics Concern   Not on file  Social History Narrative   Not on file   Social Determinants of Health   Financial Resource Strain: Not on file  Food Insecurity: Not on file  Transportation Needs: Not on file  Physical Activity: Not on file  Stress: Not on file  Social Connections: Not on file     Family History: Father had CABG in 58s  ROS:   Please see the history of present illness.     All other systems reviewed and are negative.  EKGs/Labs/Other Studies Reviewed:    The following studies were reviewed today:   EKG:  EKG is  ordered today.  The ekg ordered today demonstrates sinus rhythm, rate 63, no ST/T abnormality  Recent Labs: No results found for requested labs within last 8760 hours.  Recent Lipid Panel No results found for:  CHOL, TRIG, HDL, CHOLHDL, VLDL, LDLCALC, LDLDIRECT  Physical Exam:    VS:  BP 117/73   Pulse 74   Ht 5\' 4"  (1.626 m)   Wt 133 lb 3.2 oz (60.4 kg)   SpO2 99%   BMI 22.86 kg/m     Wt Readings from Last 3 Encounters:  02/26/21 133 lb 3.2 oz (60.4 kg)  02/04/21 130 lb (59 kg)  11/23/20 133 lb 12.8 oz (60.7 kg)     GEN: Well nourished, well developed in no acute distress HEENT: Normal NECK: No JVD; No carotid bruits LYMPHATICS: No lymphadenopathy CARDIAC: RRR, no murmurs, rubs, gallops RESPIRATORY:  Clear to auscultation without rales, wheezing or rhonchi  ABDOMEN: Soft, non-tender, non-distended MUSCULOSKELETAL:  No edema; No deformity  SKIN: Warm and  dry NEUROLOGIC:  Alert and oriented x 3 PSYCHIATRIC:  Normal affect   ASSESSMENT:    1. Palpitations   2. Chest pain of uncertain etiology   3. Hyperlipidemia, unspecified hyperlipidemia type     PLAN:     Palpitations: Echocardiogram on 12/18/2020 showed normal biventricular function, no significant valvular disease.  Zio patch x14 days on 12/18/2020 showed no significant abnormalities.  Reports palpitations have improved  Chest pain: Atypical in description, occurs during episodes of palpitations.  Suspect due to arrhythmia as above.  Low suspicion for CAD given lack of risk factors.  She is an active runner, denies any exertional chest pain.  Hyperlipidemia: LDL 116 on 10/16/2020, low ASCVD risk score.  Given family history, will check calcium score for further risk stratification  RTC in 1 year   Medication Adjustments/Labs and Tests Ordered: Current medicines are reviewed at length with the patient today.  Concerns regarding medicines are outlined above.  Orders Placed This Encounter  Procedures   CT CARDIAC SCORING (SELF PAY ONLY)    No orders of the defined types were placed in this encounter.   Patient Instructions  Medication Instructions:  Your physician recommends that you continue on your current medications as directed. Please refer to the Current Medication list given to you today.  *If you need a refill on your cardiac medications before your next appointment, please call your pharmacy*  Testing/Procedures: CT coronary calcium score. This test is done at [address]. This is $99 out of pocket.   Coronary CalciumScan A coronary calcium scan is an imaging test used to look for deposits of calcium and other fatty materials (plaques) in the inner lining of the blood vessels of the heart (coronary arteries). These deposits of calcium and plaques can partly clog and narrow the coronary arteries without producing any symptoms or warning signs. This  puts a person at risk for a heart attack. This test can detect these deposits before symptoms develop. Tell a health care provider about: Any allergies you have. All medicines you are taking, including vitamins, herbs, eye drops, creams, and over-the-counter medicines. Any problems you or family members have had with anesthetic medicines. Any blood disorders you have. Any surgeries you have had. Any medical conditions you have. Whether you are pregnant or may be pregnant. What are the risks? Generally, this is a safe procedure. However, problems may occur, including: Harm to a pregnant woman and her unborn baby. This test involves the use of radiation. Radiation exposure can be dangerous to a pregnant woman and her unborn baby. If you are pregnant, you generally should not have this procedure done. Slight increase in the risk of cancer. This is because of  the radiation involved in the test. What happens before the procedure? No preparation is needed for this procedure. What happens during the procedure? You will undress and remove any jewelry around your neck or chest. You will put on a hospital gown. Sticky electrodes will be placed on your chest. The electrodes will be connected to an electrocardiogram (ECG) machine to record a tracing of the electrical activity of your heart. A CT scanner will take pictures of your heart. During this time, you will be asked to lie still and hold your breath for 2-3 seconds while a picture of your heart is being taken. The procedure may vary among health care providers and hospitals. What happens after the procedure? You can get dressed. You can return to your normal activities. It is up to you to get the results of your test. Ask your health care provider, or the department that is doing the test, when your results will be ready. Summary A coronary calcium scan is an imaging test used to look for deposits of calcium and other fatty materials (plaques) in  the inner lining of the blood vessels of the heart (coronary arteries). Generally, this is a safe procedure. Tell your health care provider if you are pregnant or may be pregnant. No preparation is needed for this procedure. A CT scanner will take pictures of your heart. You can return to your normal activities after the scan is done. This information is not intended to replace advice given to you by your health care provider. Make sure you discuss any questions you have with your health care provider. Document Released: 02/28/2008 Document Revised: 07/21/2016 Document Reviewed: 07/21/2016 Elsevier Interactive Patient Education  2017 [employer].    Follow-Up: At [facility], you and your health needs are our priority.  As part of our continuing mission to provide you with exceptional heart care, we have created designated Provider Care Teams.  These Care Teams include your primary Cardiologist (physician) and Advanced Practice Providers (APPs -  Physician Assistants and Nurse Practitioners) who all work together to provide you with the care you need, when you need it.  We recommend signing up for the patient portal called "MyChart".  Sign up information is provided on this After Visit Summary.  MyChart is used to connect with patients for Virtual Visits (Telemedicine).  Patients are able to view lab/test results, encounter notes, upcoming appointments, etc.  Non-urgent messages can be sent to your provider as well.   To learn more about what you can do with MyChart, go to [email or web address].    Your next appointment:   12 month(s)  The format for your next appointment:   In Person  Provider:   Epifanio Lesches, MD      Signed, Little Ishikawa, MD  02/26/2021 10:36 AM    [facility]

## 2021-02-26 ENCOUNTER — Ambulatory Visit: Payer: BC Managed Care – PPO | Admitting: Cardiology

## 2021-02-26 ENCOUNTER — Encounter: Payer: Self-pay | Admitting: Cardiology

## 2021-02-26 ENCOUNTER — Other Ambulatory Visit: Payer: Self-pay

## 2021-02-26 VITALS — BP 117/73 | HR 74 | Ht 64.0 in | Wt 133.2 lb

## 2021-02-26 DIAGNOSIS — R079 Chest pain, unspecified: Secondary | ICD-10-CM | POA: Diagnosis not present

## 2021-02-26 DIAGNOSIS — R002 Palpitations: Secondary | ICD-10-CM | POA: Diagnosis not present

## 2021-02-26 DIAGNOSIS — E785 Hyperlipidemia, unspecified: Secondary | ICD-10-CM | POA: Diagnosis not present

## 2021-02-26 NOTE — Patient Instructions (Signed)
Medication Instructions:  Your physician recommends that you continue on your current medications as directed. Please refer to the Current Medication list given to you today.  *If you need a refill on your cardiac medications before your next appointment, please call your pharmacy*  Testing/Procedures: CT coronary calcium score. This test is done at [address]. This is $99 out of pocket.   Coronary CalciumScan A coronary calcium scan is an imaging test used to look for deposits of calcium and other fatty materials (plaques) in the inner lining of the blood vessels of the heart (coronary arteries). These deposits of calcium and plaques can partly clog and narrow the coronary arteries without producing any symptoms or warning signs. This puts a person at risk for a heart attack. This test can detect these deposits before symptoms develop. Tell a health care provider about: Any allergies you have. All medicines you are taking, including vitamins, herbs, eye drops, creams, and over-the-counter medicines. Any problems you or family members have had with anesthetic medicines. Any blood disorders you have. Any surgeries you have had. Any medical conditions you have. Whether you are pregnant or may be pregnant. What are the risks? Generally, this is a safe procedure. However, problems may occur, including: Harm to a pregnant woman and her unborn baby. This test involves the use of radiation. Radiation exposure can be dangerous to a pregnant woman and her unborn baby. If you are pregnant, you generally should not have this procedure done. Slight increase in the risk of cancer. This is because of the radiation involved in the test. What happens before the procedure? No preparation is needed for this procedure. What happens during the procedure? You will undress and remove any jewelry around your neck or chest. You will put on a hospital gown. Sticky electrodes will be placed on  your chest. The electrodes will be connected to an electrocardiogram (ECG) machine to record a tracing of the electrical activity of your heart. A CT scanner will take pictures of your heart. During this time, you will be asked to lie still and hold your breath for 2-3 seconds while a picture of your heart is being taken. The procedure may vary among health care providers and hospitals. What happens after the procedure? You can get dressed. You can return to your normal activities. It is up to you to get the results of your test. Ask your health care provider, or the department that is doing the test, when your results will be ready. Summary A coronary calcium scan is an imaging test used to look for deposits of calcium and other fatty materials (plaques) in the inner lining of the blood vessels of the heart (coronary arteries). Generally, this is a safe procedure. Tell your health care provider if you are pregnant or may be pregnant. No preparation is needed for this procedure. A CT scanner will take pictures of your heart. You can return to your normal activities after the scan is done. This information is not intended to replace advice given to you by your health care provider. Make sure you discuss any questions you have with your health care provider. Document Released: 02/28/2008 Document Revised: 07/21/2016 Document Reviewed: 07/21/2016 Elsevier Interactive Patient Education  2017 [employer]  Follow-Up: At [facility], you and your health needs are our priority.  As part of our continuing mission to provide you with exceptional heart care, we have created designated Provider Care Teams.  These Care Teams include your   primary Cardiologist (physician) and Advanced Practice Providers (APPs -  Physician Assistants and Nurse Practitioners) who all work together to provide you with the care you need, when you need it.  We recommend signing up for the patient portal called "MyChart".  Sign  up information is provided on this After Visit Summary.  MyChart is used to connect with patients for Virtual Visits (Telemedicine).  Patients are able to view lab/test results, encounter notes, upcoming appointments, etc.  Non-urgent messages can be sent to your provider as well.   To learn more about what you can do with MyChart, go to [email or web address].    Your next appointment:   12 month(s)  The format for your next appointment:   In Person  Provider:   Christopher Schumann, MD     

## 2021-03-25 ENCOUNTER — Other Ambulatory Visit: Payer: Self-pay

## 2021-03-25 ENCOUNTER — Ambulatory Visit (INDEPENDENT_AMBULATORY_CARE_PROVIDER_SITE_OTHER)
Admission: RE | Admit: 2021-03-25 | Discharge: 2021-03-25 | Disposition: A | Discharge status: Home / Self Care | Payer: Self-pay | Source: Ambulatory Visit | Attending: Cardiology | Admitting: Cardiology

## 2021-03-25 DIAGNOSIS — R002 Palpitations: Secondary | ICD-10-CM

## 2021-04-05 ENCOUNTER — Encounter: Payer: Self-pay | Admitting: *Deleted
